# Patient Record
Sex: Female | Born: 1960 | Race: Black or African American | Hispanic: No | Smoking: Never smoker
Health system: Southern US, Community
[De-identification: ages and names within clinical notes are randomized; demographics above are authoritative.]

## PROBLEM LIST (undated history)

## (undated) DIAGNOSIS — G629 Polyneuropathy, unspecified: Secondary | ICD-10-CM

## (undated) DIAGNOSIS — I10 Essential (primary) hypertension: Secondary | ICD-10-CM

## (undated) DIAGNOSIS — K219 Gastro-esophageal reflux disease without esophagitis: Secondary | ICD-10-CM

## (undated) DIAGNOSIS — E119 Type 2 diabetes mellitus without complications: Secondary | ICD-10-CM

## (undated) HISTORY — PX: OTHER SURGICAL HISTORY: SHX169

## (undated) HISTORY — PX: MOUTH SURGERY: SHX715

## (undated) HISTORY — PX: SHOULDER SURGERY: SHX246

## (undated) HISTORY — PX: ABLATION ON ENDOMETRIOSIS: SHX5787

---

## 2007-06-05 ENCOUNTER — Ambulatory Visit: Payer: Self-pay | Admitting: Physician Assistant

## 2007-06-05 ENCOUNTER — Inpatient Hospital Stay (HOSPITAL_COMMUNITY): Admission: AD | Admit: 2007-06-05 | Discharge: 2007-06-05 | Payer: Self-pay | Admitting: Gynecology

## 2007-06-05 ENCOUNTER — Encounter: Payer: Self-pay | Admitting: Physician Assistant

## 2007-07-08 ENCOUNTER — Encounter (INDEPENDENT_AMBULATORY_CARE_PROVIDER_SITE_OTHER): Payer: Self-pay | Admitting: Gynecology

## 2007-07-08 ENCOUNTER — Ambulatory Visit: Payer: Self-pay | Admitting: Gynecology

## 2007-08-24 ENCOUNTER — Ambulatory Visit (HOSPITAL_COMMUNITY): Admission: RE | Admit: 2007-08-24 | Discharge: 2007-08-24 | Payer: Self-pay | Admitting: Gynecology

## 2007-08-24 ENCOUNTER — Ambulatory Visit: Payer: Self-pay | Admitting: Gynecology

## 2007-09-22 ENCOUNTER — Ambulatory Visit: Payer: Self-pay | Admitting: Gynecology

## 2008-06-03 ENCOUNTER — Emergency Department (HOSPITAL_COMMUNITY): Admission: EM | Admit: 2008-06-03 | Discharge: 2008-06-03 | Payer: Self-pay | Admitting: Emergency Medicine

## 2010-07-02 NOTE — Op Note (Signed)
[name], [name]              ACCOUNT NO.:  [id number]   MEDICAL RECORD NO.:  [id number]          PATIENT TYPE:  AMB   LOCATION:  SDC                           [facility]ACILITY:  [facility]   PHYSICIAN:  Ginger Carne, MD  DATE OF BIRTH:  06-04-1960   DATE OF PROCEDURE:  DATE OF DISCHARGE:                               OPERATIVE REPORT   PREOPERATIVE DIAGNOSES:  Menorrhagia and request for sterilization.   POSTOPERATIVE DIAGNOSES:  Menorrhagia and request for sterilization.   PROCEDURE:  Bilateral laparoscopic tubal cauterization and NovaSure  endometrial ablation.   SURGEON:  Ginger Carne, MD   ASSISTANT:  None.   COMPLICATIONS:  None immediate.   ESTIMATED BLOOD LOSS:  Minimal.   ANESTHESIA:  General.   SPECIMEN:  None.   OPERATIVE FINDINGS:  External genitalia, vulva, and vagina normal.  Cervix smooth without erosions or lesions.  Laparoscopic evaluation  identified both tubes and ovaries and uterus to be within normal limits.  The tubes were identified from their isthmus to fimbriated ends separate  apart from their respective round ligaments.  The NovaSure portion of  the procedure revealed the cavity length sounding at 5.5 cm with cavity  width of 2.7 cm and power at 87 watts.   OPERATIVE PROCEDURE:  The patient was prepped and draped in the usual  fashion and placed in lithotomy position.  Betadine solution used for  antiseptic, and the patient was catheterized prior to procedure.  After  adequate general anesthesia, a tenaculum placed on the anterior lip of  the cervix and a Hickman tenaculum placed in the endocervical canal.  Afterwards, a vertical infraumbilical incision was made and a Veress  needle placed in the abdomen.  Opening and closing pressures were 10 and  15 mmHg.  Needle release trocar placed in same incision, and laparoscope  placed in the trocar sleeve.  Two 5-mm ports were made in the left lower  quadrant under direct visualization.  The tubes  were identified and  approximately 3 cm of tube was cauterized from the isthmus and directed  towards the distal portion of the tube.  The tube on either side was  then cut with scissors.  No active bleeding noted.  No injuries noted.  Gas released.  Trocars removed.  Closure of the 10-mm fascia site with 0  Vicryl suture and 4-0 Vicryl for subcuticular closures.   NovaSure endometrial ablation was then performed.  Dimensions listed as  above in the findings section.  The procedure was performed without  complication or difficulty.  At the end of the procedure, the patient  tolerated procedure well, returned to the postanesthesia recovery room  in excellent condition.      Ginger Carne, MD  Electronically Signed     [name]  D:  08/24/2007  T:  08/24/2007  Job:  [id number]

## 2010-07-02 NOTE — Group Therapy Note (Signed)
[name], [name] NO.:  [id number]   MEDICAL RECORD NO.:  [id number]          PATIENT TYPE:  WOC   LOCATION:  [facility]                   FACILITY:  [facility]   PHYSICIAN:  Ginger Carne, MD DATE OF BIRTH:  07-19-1960   DATE OF SERVICE:                                  CLINIC NOTE   REASON FOR CONSULTATION:  Persistent vaginal bleeding.   HISTORY OF PRESENT ILLNESS:  This patient is a 50 year old gravida 2,  para 1-0-1-1 African-American female who has bled consistently since  August 2008.  She was seen in the maternity admission unit on June 05, 2007.  An endometrial biopsy was performed and demonstrated benign  disordered proliferative endometrial pattern without evidence of  neoplasia or hyperplasia.  A pelvic sonogram obtained on the same date  indicated multiple small uterine fibroids with a total sagittal length  of 8.2 cm.  Both adnexa were normal.  The patient takes no medications  to enhance her bleeding propensity and has no personal or family history  of bleeding diatheses.  The patient states she bleeds approximately  every 2 weeks with tapering down the last several days before restarting  again.  She has not been actively managed for said bleeding until now.  The patient is hypertensive and type 2 diabetic, and not a candidate for  contraceptive management.   ALLERGIES:  NONE to latex, iodine, medication or foods.   CURRENT MEDICATIONS:  1. Ferrex 150 mg once at night.  2. Metformin 500 mg in the morning daily.  3. Potassium chloride 10 mEq 1 daily.  4. Hydrochlorothiazide 25 mg 1 daily.  5. Naprosyn 500 mg 1 as needed.  6. Timolol Maleate drops to the left eye twice a day.  7. Nexium 40 mg daily.  8. Singulair 10 mg daily.   OBSTETRICAL/GYNECOLOGICAL HISTORY:  The patient has had 1 cesarean  section and 1 first trimester miscarriage.  She uses condoms for birth  control.   MEDICAL HISTORY:  1. Type 2 diabetes.  2. Hypertension.  3. Asthma.  4. Glaucoma.   PAST SURGICAL HISTORY:  Shoulder surgery in 1992.   SOCIAL HISTORY:  The patient is a nonsmoker.  Denies alcohol or illicit  drug abuse.   FAMILY HISTORY:  Her father has had a myocardial infarction.  Her sister  has hypertension.   REVIEW OF SYSTEMS:  A 14-point comprehensive review of systems within  normal limits.   PHYSICAL EXAMINATION:  VITAL SIGNS:  Blood pressure is 116/81, weight  194 pounds, height 5 feet 5 inches, pulse 73 and regular.  HEENT:  Grossly normal.  BREASTS:  Without masses, discharge, thickenings or tenderness.  CHEST:  Clear to percussion and auscultation.  CARDIOVASCULAR:  Without murmurs or enlargements, regular rate and  rhythm.  EXTREMITY, LYMPHATICS, SKIN, NEUROLOGICAL, MUSCULOSKELETAL  SYSTEMS:  Within normal limits.  ABDOMEN:  Soft without gross hepatosplenomegaly.  PELVIC:  External genitalia, vulva and vagina are normal.  Cervix smooth  without erosions or lesions.  The patient has slight bleeding, but a Pap  smear was performed.  Uterus is palpated to be about 8 weeks in size.  Both adnexa are palpable and found to be normal.   IMPRESSION:  Persistent vaginal bleeding, menometrorrhagia.   PLAN:  The patient was offered a Mirena IUD and endometrial ablation  (NovaSure technique) with accompanying laparoscopic bilateral tubal  cauterization and/or total vaginal hysterectomy with preservation of  both tubes and ovaries.  The patient opted for the laparoscopic  bilateral tubal cauterization and NovaSure endometrial tubal ablation.  Ashby Dawes of said procedure discussed in detail.  The patient understands  there is a 50% chance of persistent bleeding in the future, 50% will not  bleed at all.  The 50% who bleed may have minimal-moderate flow.  The  patient is accepting of this procedure.  The nature of a laparoscopic  tubal ligation was discussed and understood that the risk of failure was  02/998.  The patient will undergo  said procedure in the near future.           ______________________________  Ginger Carne, MD     SHB/MEDQ  D:  07/08/2007  T:  07/08/2007  Job:  [id number]

## 2010-07-02 NOTE — Group Therapy Note (Signed)
[name], TEAT NO.:  [id number]   MEDICAL RECORD NO.:  [id number]          PATIENT TYPE:  WOC   LOCATION:  [facility]                   FACILITY:  [facility]   PHYSICIAN:  Ginger Carne, MD DATE OF BIRTH:  09-Aug-1960   DATE OF SERVICE:  09/22/2007                                  CLINIC NOTE   Ms. [name] returns today as a 50 year old African American female, who  on August 24, 2007, had undergone a bilateral laparoscopic tubal  cauterization and NovaSure endometrial ablation.  Apart from one day of  spotting, the patient has had no further bleeding or cramping.  Endometrial biopsy from 06/05/2007, revealed fragments of benign  endometrial polyp, disordered proliferative endometrium, without  neoplasia or hyperplasia.   PHYSICAL EXAM:  Blood pressure is 115/78, weight 197 pounds, height 5  feet 5 inches, pulse 66 and regular.  ABDOMEN:  Soft.  Incision is well-healed.  PELVIC:  Deferred.   IMPRESSION AND PLAN:  Patient was advised to return for her yearly  examination.  She can resume all activities, including intercourse, and  she was informed that, from time to time, she may experience some  spotting or bleeding, but that this is expected and not to be construed  as the procedure being ineffective.           ______________________________  Ginger Carne, MD     SHB/MEDQ  D:  09/22/2007  T:  09/22/2007  Job:  [id number]

## 2010-11-12 LAB — WET PREP, GENITAL
Clue Cells Wet Prep HPF POC: NONE SEEN
Trich, Wet Prep: NONE SEEN
Yeast Wet Prep HPF POC: NONE SEEN

## 2010-11-12 LAB — CBC
MCHC: 34.1
MCV: 90.1
Platelets: 203
WBC: 5.2

## 2010-11-12 LAB — POCT PREGNANCY, URINE: Operator id: 181461

## 2010-11-12 LAB — GC/CHLAMYDIA PROBE AMP, GENITAL: GC Probe Amp, Genital: NEGATIVE

## 2010-11-14 LAB — CBC
RBC: 3.85 — ABNORMAL LOW
WBC: 5

## 2010-11-14 LAB — BASIC METABOLIC PANEL
Calcium: 9.1
Creatinine, Ser: 0.76
GFR calc Af Amer: >60
Sodium: 137

## 2012-12-03 DIAGNOSIS — G43109 Migraine with aura, not intractable, without status migrainosus: Secondary | ICD-10-CM | POA: Insufficient documentation

## 2013-05-12 ENCOUNTER — Encounter (HOSPITAL_BASED_OUTPATIENT_CLINIC_OR_DEPARTMENT_OTHER): Payer: Self-pay | Admitting: Emergency Medicine

## 2013-05-12 ENCOUNTER — Emergency Department (HOSPITAL_BASED_OUTPATIENT_CLINIC_OR_DEPARTMENT_OTHER)
Admission: EM | Admit: 2013-05-12 | Discharge: 2013-05-12 | Disposition: A | Discharge status: Home / Self Care | Payer: Self-pay | Attending: Emergency Medicine | Admitting: Emergency Medicine

## 2013-05-12 ENCOUNTER — Emergency Department (HOSPITAL_BASED_OUTPATIENT_CLINIC_OR_DEPARTMENT_OTHER): Payer: Self-pay

## 2013-05-12 DIAGNOSIS — Y9389 Activity, other specified: Secondary | ICD-10-CM | POA: Insufficient documentation

## 2013-05-12 DIAGNOSIS — I10 Essential (primary) hypertension: Secondary | ICD-10-CM | POA: Insufficient documentation

## 2013-05-12 DIAGNOSIS — M79673 Pain in unspecified foot: Secondary | ICD-10-CM

## 2013-05-12 DIAGNOSIS — Z79899 Other long term (current) drug therapy: Secondary | ICD-10-CM | POA: Insufficient documentation

## 2013-05-12 DIAGNOSIS — E119 Type 2 diabetes mellitus without complications: Secondary | ICD-10-CM | POA: Insufficient documentation

## 2013-05-12 DIAGNOSIS — S8990XA Unspecified injury of unspecified lower leg, initial encounter: Secondary | ICD-10-CM | POA: Insufficient documentation

## 2013-05-12 DIAGNOSIS — G589 Mononeuropathy, unspecified: Secondary | ICD-10-CM | POA: Insufficient documentation

## 2013-05-12 DIAGNOSIS — W06XXXA Fall from bed, initial encounter: Secondary | ICD-10-CM | POA: Insufficient documentation

## 2013-05-12 DIAGNOSIS — S99919A Unspecified injury of unspecified ankle, initial encounter: Principal | ICD-10-CM

## 2013-05-12 DIAGNOSIS — Y929 Unspecified place or not applicable: Secondary | ICD-10-CM | POA: Insufficient documentation

## 2013-05-12 DIAGNOSIS — K219 Gastro-esophageal reflux disease without esophagitis: Secondary | ICD-10-CM | POA: Insufficient documentation

## 2013-05-12 DIAGNOSIS — S99929A Unspecified injury of unspecified foot, initial encounter: Principal | ICD-10-CM

## 2013-05-12 HISTORY — DX: Gastro-esophageal reflux disease without esophagitis: K21.9

## 2013-05-12 HISTORY — DX: Type 2 diabetes mellitus without complications: E11.9

## 2013-05-12 HISTORY — DX: Polyneuropathy, unspecified: G62.9

## 2013-05-12 HISTORY — DX: Essential (primary) hypertension: I10

## 2013-05-12 LAB — CBC
HEMATOCRIT: 39 % (ref 36.0–46.0)
HEMOGLOBIN: 13 g/dL (ref 12.0–15.0)
MCH: 31.4 pg (ref 26.0–34.0)
MCHC: 33.3 g/dL (ref 30.0–36.0)
MCV: 94.2 fL (ref 78.0–100.0)
Platelets: 169 10*3/uL (ref 150–400)
RBC: 4.14 MIL/uL (ref 3.87–5.11)
RDW: 12 % (ref 11.5–15.5)
WBC: 4.5 10*3/uL (ref 4.0–10.5)

## 2013-05-12 LAB — CBG MONITORING, ED: Glucose-Capillary: 128 mg/dL — ABNORMAL HIGH (ref 70–99)

## 2013-05-12 LAB — BASIC METABOLIC PANEL
BUN: 16 mg/dL (ref 6–23)
CO2: 27 mEq/L (ref 19–32)
CREATININE: 0.9 mg/dL (ref 0.50–1.10)
Calcium: 9.3 mg/dL (ref 8.4–10.5)
Chloride: 103 mEq/L (ref 96–112)
GFR, EST AFRICAN AMERICAN: 83 mL/min — AB (ref >90)
GFR, EST NON AFRICAN AMERICAN: 72 mL/min — AB (ref >90)
GLUCOSE: 128 mg/dL — AB (ref 70–99)
Potassium: 3.9 mEq/L (ref 3.7–5.3)
Sodium: 142 mEq/L (ref 137–147)

## 2013-05-12 MED ORDER — TRAMADOL HCL 50 MG PO TABS: 50.0000 mg | ORAL_TABLET | Freq: Four times a day (QID) | ORAL | Status: DC | PRN

## 2013-05-12 NOTE — ED Notes (Signed)
Pt. Daughter is Seth Bake phone # [phone]

## 2013-05-12 NOTE — ED Provider Notes (Signed)
CSN: [id number]     Arrival date & time 05/12/13  1501 History   First MD Initiated Contact with Patient 05/12/13 1505     Chief Complaint  Patient presents with  . Foot Pain     (Consider location/radiation/quality/duration/timing/severity/associated sxs/prior Treatment) HPI Comments: Pt states she has been having cramping in the right foot for the last year. Pt states that it caused her to fall getting out of bed this morning. Pt states that sometimes the ankle gives out on her. Pt states that her pcp told her that she need to see ortho but she hasn't been able to. Pt states that she feels like the right side is week but that has been going on for a year and she has been evaluated for it and nothing is ever found  The history is provided by the patient. No language interpreter was used.    Past Medical History  Diagnosis Date  . Diabetes mellitus without complication   . Hypertension   . GERD (gastroesophageal reflux disease)   . Neuropathy    Past Surgical History  Procedure Laterality Date  . Shouder surgery     No family history on file. History  Substance Use Topics  . Smoking status: Never Smoker   . Smokeless tobacco: Not on file  . Alcohol Use: No   OB History   Grav Para Term Preterm Abortions TAB SAB Ect Mult Living                 Review of Systems  Constitutional: Negative.   Respiratory: Negative.   Cardiovascular: Negative.       Allergies  Review of patient's allergies indicates no known allergies.  Home Medications   Current Outpatient Rx  Name  Route  Sig  Dispense  Refill  . esomeprazole (NEXIUM) 40 MG capsule   Oral   Take 40 mg by mouth daily at 12 noon.         . gabapentin (NEURONTIN) 300 MG capsule   Oral   Take 300 mg by mouth 3 (three) times daily.         Marland Kitchen HYDROCHLOROTHIAZIDE PO   Oral   Take by mouth.         Marland Kitchen lisinopril (PRINIVIL,ZESTRIL) 5 MG tablet   Oral   Take 5 mg by mouth daily.         Marland Kitchen loratadine  (CLARITIN) 10 MG tablet   Oral   Take 10 mg by mouth daily.         . metFORMIN (GLUCOPHAGE) 500 MG tablet   Oral   Take by mouth 2 (two) times daily with a meal.         . montelukast (SINGULAIR) 10 MG tablet   Oral   Take 10 mg by mouth at bedtime.         . potassium chloride (K-DUR) 10 MEQ tablet   Oral   Take 10 mEq by mouth daily.          BP 126/95  Pulse 89  Temp(Src) 97.6 F (36.4 C) (Oral)  Resp 20  Ht 5\' 5"  (1.651 m)  Wt 187 lb (84.823 kg)  BMI 31.12 kg/m2  SpO2 100% Physical Exam  Nursing note and vitals reviewed. Constitutional: She is oriented to person, place, and time. She appears well-developed and well-nourished.  Cardiovascular: Normal rate and regular rhythm.   Pulmonary/Chest: Effort normal and breath sounds normal.  Musculoskeletal:  Pt has generalized pain with palpation to the  right ankle.no gross deformity noted. Pt ambulates without assistance;no abnormality noted  Neurological: She is alert and oriented to person, place, and time. She exhibits normal muscle tone. Coordination normal.  Skin: Skin is warm and dry.  Psychiatric: She has a normal mood and affect.    ED Course  Procedures (including critical care time) Labs Review Labs Reviewed  BASIC METABOLIC PANEL - Abnormal; Notable for the following:    Glucose, Bld 128 (*)    GFR calc non Af Amer 72 (*)    GFR calc Af Amer 83 (*)    All other components within normal limits  CBG MONITORING, ED - Abnormal; Notable for the following:    Glucose-Capillary 128 (*)    All other components within normal limits  CBC   Imaging Review Dg Ankle Complete Right  05/12/2013   CLINICAL DATA:  [place] about today.  Foot and ankle pain.  EXAM: RIGHT ANKLE - COMPLETE 3+ VIEW  COMPARISON:  None.  FINDINGS: There is no evidence of fracture, dislocation, or joint effusion. There is no evidence of arthropathy or other focal bone abnormality. Soft tissues are unremarkable.  IMPRESSION: Negative.    Electronically Signed   By: Shon Hale M.D.   On: 05/12/2013 15:51   Dg Foot Complete Right  05/12/2013   CLINICAL DATA:  [place] out of bed today.  Right foot and ankle pain.  EXAM: RIGHT FOOT COMPLETE - 3+ VIEW  COMPARISON:  None  FINDINGS: There is no evidence of fracture or dislocation. There is no evidence of arthropathy or other focal bone abnormality. Soft tissues are unremarkable.  IMPRESSION: Negative.   Electronically Signed   By: Shon Hale M.D.   On: 05/12/2013 15:50     EKG Interpretation None      MDM   Final diagnoses:  Foot pain    No swelling noted. Pt is neurologically intact. No fracture noted. Will treat for pain    Glendell Docker, NP 05/12/13 1702

## 2013-05-12 NOTE — ED Notes (Signed)
CBG done per NP verbal order.  128 result, RN at bedside aware.

## 2013-05-12 NOTE — ED Notes (Signed)
Pain and cramping in her right foot. Pain caused her to fall when getting out of bed this am. She refused w/c and insisted on walking to tx room.

## 2013-05-12 NOTE — ED Notes (Signed)
Pt. Daughter called by Eldridge Dace.  Pt. Able to walk with no distress and aware of her discharge diagnosis.

## 2013-05-12 NOTE — Discharge Instructions (Signed)

## 2013-05-12 NOTE — ED Provider Notes (Signed)
Medical screening examination/treatment/procedure(s) were performed by non-physician practitioner and as supervising physician I was immediately available for consultation/collaboration.   EKG Interpretation None        Jerica Creegan, MD 05/12/13 1816 

## 2013-05-12 NOTE — ED Notes (Signed)
Mild weakness noted for a year per Pt. On the R side.

## 2013-05-18 ENCOUNTER — Emergency Department (HOSPITAL_BASED_OUTPATIENT_CLINIC_OR_DEPARTMENT_OTHER)
Admission: EM | Admit: 2013-05-18 | Discharge: 2013-05-18 | Disposition: A | Discharge status: Home / Self Care | Payer: Self-pay | Attending: Emergency Medicine | Admitting: Emergency Medicine

## 2013-05-18 ENCOUNTER — Encounter (HOSPITAL_BASED_OUTPATIENT_CLINIC_OR_DEPARTMENT_OTHER): Payer: Self-pay | Admitting: Emergency Medicine

## 2013-05-18 ENCOUNTER — Emergency Department (HOSPITAL_BASED_OUTPATIENT_CLINIC_OR_DEPARTMENT_OTHER): Payer: Self-pay

## 2013-05-18 DIAGNOSIS — G589 Mononeuropathy, unspecified: Secondary | ICD-10-CM | POA: Insufficient documentation

## 2013-05-18 DIAGNOSIS — M79609 Pain in unspecified limb: Secondary | ICD-10-CM | POA: Insufficient documentation

## 2013-05-18 DIAGNOSIS — I10 Essential (primary) hypertension: Secondary | ICD-10-CM | POA: Insufficient documentation

## 2013-05-18 DIAGNOSIS — M79676 Pain in unspecified toe(s): Secondary | ICD-10-CM

## 2013-05-18 DIAGNOSIS — E119 Type 2 diabetes mellitus without complications: Secondary | ICD-10-CM | POA: Insufficient documentation

## 2013-05-18 DIAGNOSIS — Z79899 Other long term (current) drug therapy: Secondary | ICD-10-CM | POA: Insufficient documentation

## 2013-05-18 DIAGNOSIS — B351 Tinea unguium: Secondary | ICD-10-CM | POA: Insufficient documentation

## 2013-05-18 DIAGNOSIS — K219 Gastro-esophageal reflux disease without esophagitis: Secondary | ICD-10-CM | POA: Insufficient documentation

## 2013-05-18 MED ORDER — FLUCONAZOLE 200 MG PO TABS: 200.0000 mg | ORAL_TABLET | ORAL | Status: AC

## 2013-05-18 MED ORDER — HYDROCODONE-ACETAMINOPHEN 5-325 MG PO TABS: 1.0000 | ORAL_TABLET | ORAL | Status: DC | PRN

## 2013-05-18 NOTE — ED Notes (Signed)
Pt c/o left great toe pain x 2 months. Pt ambulatory.

## 2013-05-18 NOTE — ED Notes (Signed)
Patient left big tow pain. Swelling and pain toward the outer side.

## 2013-05-18 NOTE — ED Provider Notes (Addendum)
CSN: [id number]     Arrival date & time 05/18/13  1318 History   First MD Initiated Contact with Patient 05/18/13 1502     Chief Complaint  Patient presents with  . Toe Pain     (Consider location/radiation/quality/duration/timing/severity/associated sxs/prior Treatment) Patient is a 53 y.o. female presenting with toe pain. The history is provided by the patient.  Toe Pain This is a new problem. Episode onset: 2 months. The problem occurs constantly. The problem has been gradually worsening. Associated symptoms comments: Pain in the great toe for the last 2 months and is worsening.  Sometimes pain in the 2nd and 3rd toe.  Ocassional swelling but no redness or lesions. The symptoms are aggravated by walking. Nothing relieves the symptoms. Treatments tried: tramadol. The treatment provided no relief.    Past Medical History  Diagnosis Date  . Diabetes mellitus without complication   . Hypertension   . GERD (gastroesophageal reflux disease)   . Neuropathy    Past Surgical History  Procedure Laterality Date  . Shouder surgery     No family history on file. History  Substance Use Topics  . Smoking status: Never Smoker   . Smokeless tobacco: Not on file  . Alcohol Use: No   OB History   Grav Para Term Preterm Abortions TAB SAB Ect Mult Living                 Review of Systems  All other systems reviewed and are negative.      Allergies  Review of patient's allergies indicates no known allergies.  Home Medications   Current Outpatient Rx  Name  Route  Sig  Dispense  Refill  . esomeprazole (NEXIUM) 40 MG capsule   Oral   Take 40 mg by mouth daily at 12 noon.         . gabapentin (NEURONTIN) 300 MG capsule   Oral   Take 300 mg by mouth 3 (three) times daily.         Marland Kitchen HYDROCHLOROTHIAZIDE PO   Oral   Take by mouth.         Marland Kitchen lisinopril (PRINIVIL,ZESTRIL) 5 MG tablet   Oral   Take 5 mg by mouth daily.         Marland Kitchen loratadine (CLARITIN) 10 MG tablet    Oral   Take 10 mg by mouth daily.         . metFORMIN (GLUCOPHAGE) 500 MG tablet   Oral   Take by mouth 2 (two) times daily with a meal.         . montelukast (SINGULAIR) 10 MG tablet   Oral   Take 10 mg by mouth at bedtime.         . potassium chloride (K-DUR) 10 MEQ tablet   Oral   Take 10 mEq by mouth daily.         . traMADol (ULTRAM) 50 MG tablet   Oral   Take 1 tablet (50 mg total) by mouth every 6 (six) hours as needed.   15 tablet   0    BP 128/89  Pulse 92  Temp(Src) 98.5 F (36.9 C) (Oral)  Resp 16  Ht 5\' 5"  (1.651 m)  Wt 187 lb (84.823 kg)  BMI 31.12 kg/m2  SpO2 99% Physical Exam  Nursing note and vitals reviewed. Constitutional: She is oriented to person, place, and time. She appears well-developed and well-nourished. No distress.  HENT:  Head: Normocephalic and atraumatic.  Eyes: Pupils  are equal, round, and reactive to light.  Cardiovascular: Normal rate.   Pulmonary/Chest: Effort normal.  Musculoskeletal: She exhibits tenderness.       Feet:  Neurological: She is alert and oriented to person, place, and time.    ED Course  Procedures (including critical care time) Labs Review Labs Reviewed - No data to display Imaging Review Dg Toe Great Left  05/18/2013   CLINICAL DATA:  Two-month history of pain  EXAM: LEFT FIRST TOE  COMPARISON:  None.  FINDINGS: Frontal, oblique, and lateral views were obtained. There is no fracture or dislocation. Joint spaces appear intact. No erosive change.  IMPRESSION: No abnormality noted.   Electronically Signed   By: Lowella Grip M.D.   On: 05/18/2013 15:27     EKG Interpretation None      MDM   Final diagnoses:  Toe pain  Onychomycosis   Pt with toe pain for 2 months without external signs of infection or diabetic ulcer.  Does have onychomycosis of the 1st-3rd nail.  Denies any injury but pain is worsening.  Plain film neg and will treat for onychomycosis with Diflucan as patient states she  recently lost her job and insurance and cannot portly muscle. Give her followup with podiatry if symptoms do not improve.    Blanchie Dessert, MD 05/18/13 [place], MD 05/18/13 1554

## 2013-06-07 ENCOUNTER — Emergency Department (HOSPITAL_BASED_OUTPATIENT_CLINIC_OR_DEPARTMENT_OTHER)
Admission: EM | Admit: 2013-06-07 | Discharge: 2013-06-07 | Disposition: A | Discharge status: Home / Self Care | Payer: Self-pay | Attending: Emergency Medicine | Admitting: Emergency Medicine

## 2013-06-07 ENCOUNTER — Encounter (HOSPITAL_BASED_OUTPATIENT_CLINIC_OR_DEPARTMENT_OTHER): Payer: Self-pay | Admitting: Emergency Medicine

## 2013-06-07 DIAGNOSIS — I10 Essential (primary) hypertension: Secondary | ICD-10-CM | POA: Insufficient documentation

## 2013-06-07 DIAGNOSIS — T783XXA Angioneurotic edema, initial encounter: Secondary | ICD-10-CM | POA: Insufficient documentation

## 2013-06-07 DIAGNOSIS — T465X5A Adverse effect of other antihypertensive drugs, initial encounter: Secondary | ICD-10-CM | POA: Insufficient documentation

## 2013-06-07 DIAGNOSIS — Z79899 Other long term (current) drug therapy: Secondary | ICD-10-CM | POA: Insufficient documentation

## 2013-06-07 DIAGNOSIS — K219 Gastro-esophageal reflux disease without esophagitis: Secondary | ICD-10-CM | POA: Insufficient documentation

## 2013-06-07 DIAGNOSIS — E119 Type 2 diabetes mellitus without complications: Secondary | ICD-10-CM | POA: Insufficient documentation

## 2013-06-07 NOTE — Discharge Instructions (Signed)
Stop your lisinopril and do not take any ace inhibitors in the future.  Recheck with your doctor to determine a new type of blood pressure medicine.  Return here immediately if any worsening swelling especially if you have any difficulty speaking, swallowing, or breathing.  Angioedema Angioedema is sudden puffiness (swelling), often of the skin. It can happen:  On your face or privates (genitals).  In your belly (abdomen) or other body parts. It usually happens quickly and gets better in 1 or 2 days. It often starts at night and is found when you wake up. You may get red, itchy patches of skin (hives). Attacks can be dangerous if your breathing passages get puffy. The condition may happen only once, or it can come back at random times. It may happen for several years before it goes away for good. HOME CARE  Only take medicines as told by your doctor.  Always carry your emergency allergy medicines with you.  Wear a medical bracelet as told by your doctor.  Avoid things that you know will cause attacks (triggers). GET HELP IF:  You have another attack.  Your attacks happen more often or get worse.  The condition was passed to you by your parents and you want to have children. GET HELP RIGHT AWAY IF:   Your mouth, tongue, or lips are very puffy.  You have trouble breathing.  You have trouble swallowing.  You pass out (faint). MAKE SURE YOU:   Understand these instructions.  Will watch your condition.  Will get help right away if you are not doing well or get worse. Document Released: 01/22/2009 Document Revised: 11/24/2012 Document Reviewed: 09/27/2012 [facility] Patient Information 2014 [place], [place].

## 2013-06-07 NOTE — ED Notes (Signed)
Lower lip swelling x2 hours.  Has been taking Lisinopril less than 1 month.

## 2013-06-14 ENCOUNTER — Encounter (HOSPITAL_BASED_OUTPATIENT_CLINIC_OR_DEPARTMENT_OTHER): Payer: Self-pay | Admitting: Emergency Medicine

## 2013-06-14 ENCOUNTER — Emergency Department (HOSPITAL_BASED_OUTPATIENT_CLINIC_OR_DEPARTMENT_OTHER)
Admission: EM | Admit: 2013-06-14 | Discharge: 2013-06-14 | Disposition: A | Discharge status: Home / Self Care | Payer: Self-pay | Attending: Emergency Medicine | Admitting: Emergency Medicine

## 2013-06-14 ENCOUNTER — Emergency Department (HOSPITAL_BASED_OUTPATIENT_CLINIC_OR_DEPARTMENT_OTHER): Payer: Self-pay

## 2013-06-14 DIAGNOSIS — M5380 Other specified dorsopathies, site unspecified: Secondary | ICD-10-CM | POA: Insufficient documentation

## 2013-06-14 DIAGNOSIS — Z79899 Other long term (current) drug therapy: Secondary | ICD-10-CM | POA: Insufficient documentation

## 2013-06-14 DIAGNOSIS — Y9389 Activity, other specified: Secondary | ICD-10-CM | POA: Insufficient documentation

## 2013-06-14 DIAGNOSIS — Y929 Unspecified place or not applicable: Secondary | ICD-10-CM | POA: Insufficient documentation

## 2013-06-14 DIAGNOSIS — R296 Repeated falls: Secondary | ICD-10-CM | POA: Insufficient documentation

## 2013-06-14 DIAGNOSIS — IMO0002 Reserved for concepts with insufficient information to code with codable children: Secondary | ICD-10-CM | POA: Insufficient documentation

## 2013-06-14 DIAGNOSIS — M62838 Other muscle spasm: Secondary | ICD-10-CM

## 2013-06-14 DIAGNOSIS — E119 Type 2 diabetes mellitus without complications: Secondary | ICD-10-CM | POA: Insufficient documentation

## 2013-06-14 DIAGNOSIS — Z87828 Personal history of other (healed) physical injury and trauma: Secondary | ICD-10-CM | POA: Insufficient documentation

## 2013-06-14 DIAGNOSIS — M549 Dorsalgia, unspecified: Secondary | ICD-10-CM

## 2013-06-14 DIAGNOSIS — K219 Gastro-esophageal reflux disease without esophagitis: Secondary | ICD-10-CM | POA: Insufficient documentation

## 2013-06-14 DIAGNOSIS — I10 Essential (primary) hypertension: Secondary | ICD-10-CM | POA: Insufficient documentation

## 2013-06-14 DIAGNOSIS — G8929 Other chronic pain: Secondary | ICD-10-CM | POA: Insufficient documentation

## 2013-06-14 DIAGNOSIS — G589 Mononeuropathy, unspecified: Secondary | ICD-10-CM | POA: Insufficient documentation

## 2013-06-14 MED ORDER — IBUPROFEN 800 MG PO TABS
800.0000 mg | ORAL_TABLET | Freq: Once | ORAL | Status: AC
  Administered 2013-06-14: 800 mg via ORAL
  Filled 2013-06-14: qty 1

## 2013-06-14 MED ORDER — MORPHINE SULFATE 4 MG/ML IJ SOLN
6.0000 mg | Freq: Once | INTRAMUSCULAR | Status: AC
  Administered 2013-06-14: 6 mg via INTRAMUSCULAR
  Filled 2013-06-14: qty 2

## 2013-06-14 MED ORDER — IBUPROFEN 800 MG PO TABS: 800.0000 mg | ORAL_TABLET | Freq: Three times a day (TID) | ORAL | Status: DC | PRN

## 2013-06-14 MED ORDER — ONDANSETRON 4 MG PO TBDP
4.0000 mg | ORAL_TABLET | Freq: Once | ORAL | Status: AC
  Administered 2013-06-14: 4 mg via ORAL
  Filled 2013-06-14: qty 1

## 2013-06-14 MED ORDER — TRAMADOL HCL 50 MG PO TABS: 50.0000 mg | ORAL_TABLET | Freq: Four times a day (QID) | ORAL | Status: DC | PRN
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
## 2013-06-14 NOTE — Discharge Instructions (Signed)
Back Pain, Adult Low back pain is very common. About 1 in 5 people have back pain.The cause of low back pain is rarely dangerous. The pain often gets better over time.About half of people with a sudden onset of back pain feel better in just 2 weeks. About 8 in 10 people feel better by 6 weeks.  CAUSES Some common causes of back pain include:  Strain of the muscles or ligaments supporting the spine.  Wear and tear (degeneration) of the spinal discs.  Arthritis.  Direct injury to the back. DIAGNOSIS Most of the time, the direct cause of low back pain is not known.However, back pain can be treated effectively even when the exact cause of the pain is unknown.Answering your caregiver's questions about your overall health and symptoms is one of the most accurate ways to make sure the cause of your pain is not dangerous. If your caregiver needs more information, he or she may order lab work or imaging tests (X-rays or MRIs).However, even if imaging tests show changes in your back, this usually does not require surgery. HOME CARE INSTRUCTIONS For many people, back pain returns.Since low back pain is rarely dangerous, it is often a condition that people can learn to [facility] their own.   Remain active. It is stressful on the back to sit or stand in one place. Do not sit, drive, or stand in one place for more than 30 minutes at a time. Take short walks on level surfaces as soon as pain allows.Try to increase the length of time you walk each day.  Do not stay in bed.Resting more than 1 or 2 days can delay your recovery.  Do not avoid exercise or work.Your body is made to move.It is not dangerous to be active, even though your back may hurt.Your back will likely heal faster if you return to being active before your pain is gone.  Pay attention to your body when you bend and lift. Many people have less discomfortwhen lifting if they bend their knees, keep the load close to their bodies,and  avoid twisting. Often, the most comfortable positions are those that put less stress on your recovering back.  Find a comfortable position to sleep. Use a firm mattress and lie on your side with your knees slightly bent. If you lie on your back, put a pillow under your knees.  Only take over-the-counter or prescription medicines as directed by your caregiver. Over-the-counter medicines to reduce pain and inflammation are often the most helpful.Your caregiver may prescribe muscle relaxant drugs.These medicines help dull your pain so you can more quickly return to your normal activities and healthy exercise.  Put ice on the injured area.  Put ice in a plastic bag.  Place a towel between your skin and the bag.  Leave the ice on for 15-20 minutes, 03-04 times a day for the first 2 to 3 days. After that, ice and heat may be alternated to reduce pain and spasms.  Ask your caregiver about trying back exercises and gentle massage. This may be of some benefit.  Avoid feeling anxious or stressed.Stress increases muscle tension and can worsen back pain.It is important to recognize when you are anxious or stressed and learn ways to manage it.Exercise is a great option. SEEK MEDICAL CARE IF:  You have pain that is not relieved with rest or medicine.  You have pain that does not improve in 1 week.  You have new symptoms.  You are generally not feeling well. SEEK  IMMEDIATE MEDICAL CARE IF:   You have pain that radiates from your back into your legs.  You develop new bowel or bladder control problems.  You have unusual weakness or numbness in your arms or legs.  You develop nausea or vomiting.  You develop abdominal pain.  You feel faint. Document Released: 02/03/2005 Document Revised: 08/05/2011 Document Reviewed: 06/24/2010 [employer] Patient Information 2014 [employer]. Back Exercises Back exercises help treat and prevent back injuries. The goal of back exercises is to increase  the strength of your abdominal and back muscles and the flexibility of your back. These exercises should be started when you no longer have back pain. Back exercises include:  Pelvic Tilt. Lie on your back with your knees bent. Tilt your pelvis until the lower part of your back is against the floor. Hold this position 5 to 10 sec and repeat 5 to 10 times.  Knee to Chest. Pull first 1 knee up against your chest and hold for 20 to 30 seconds, repeat this with the other knee, and then both knees. This may be done with the other leg straight or bent, whichever feels better.  Sit-Ups or Curl-Ups. Bend your knees 90 degrees. Start with tilting your pelvis, and do a partial, slow sit-up, lifting your trunk only 30 to 45 degrees off the floor. Take at least 2 to 3 seconds for each sit-up. Do not do sit-ups with your knees out straight. If partial sit-ups are difficult, simply do the above but with only tightening your abdominal muscles and holding it as directed.  Hip-Lift. Lie on your back with your knees flexed 90 degrees. Push down with your feet and shoulders as you raise your hips a couple inches off the floor; hold for 10 seconds, repeat 5 to 10 times.  Back arches. Lie on your stomach, propping yourself up on bent elbows. Slowly press on your hands, causing an arch in your low back. Repeat 3 to 5 times. Any initial stiffness and discomfort should lessen with repetition over time.  Shoulder-Lifts. Lie face down with arms beside your body. Keep hips and torso pressed to floor as you slowly lift your head and shoulders off the floor. Do not overdo your exercises, especially in the beginning. Exercises may cause you some mild back discomfort which lasts for a few minutes; however, if the pain is more severe, or lasts for more than 15 minutes, do not continue exercises until you see your caregiver. Improvement with exercise therapy for back problems is slow.  See your caregivers for assistance with  developing a proper back exercise program. Document Released: 03/13/2004 Document Revised: 04/28/2011 Document Reviewed: 12/05/2010 [employer] Patient Information 2014 [employer].  

## 2013-06-14 NOTE — ED Notes (Signed)
Chronic back pain. [place] this am and has had pain in her lower back since.

## 2013-06-15 NOTE — ED Provider Notes (Signed)
CSN: [id number]     Arrival date & time 05/18/13  1318 History   First MD Initiated Contact with Patient 05/18/13 1502     Chief Complaint  Patient presents with  . Toe Pain     (Consider location/radiation/quality/duration/timing/severity/associated sxs/prior Treatment) HPI This is a 53 year old female who came in today after swelling that began on her lower lip after taking a new medication. She denies any tongue swelling throat swelling or difficulty breathing. It began approximately an hour prior to my evaluation. She came in secondary to this. She has not noticed any similar events in the past. She has recently started taking lisinopril for her high blood pressure.  Past Medical History  Diagnosis Date  . Diabetes mellitus without complication   . Hypertension   . GERD (gastroesophageal reflux disease)   . Neuropathy    Past Surgical History  Procedure Laterality Date  . Shouder surgery    . Ablation on endometriosis     No family history on file. History  Substance Use Topics  . Smoking status: Never Smoker   . Smokeless tobacco: Not on file  . Alcohol Use: No   OB History   Grav Para Term Preterm Abortions TAB SAB Ect Mult Living                 Review of Systems  All other systems reviewed and are negative.     Allergies  Ace inhibitors  Home Medications   Prior to Admission medications   Medication Sig Start Date End Date Taking? Authorizing Provider  esomeprazole (NEXIUM) 40 MG capsule Take 40 mg by mouth daily at 12 noon.    Historical Provider, MD  gabapentin (NEURONTIN) 300 MG capsule Take 300 mg by mouth 3 (three) times daily.    Historical Provider, MD  HYDROCHLOROTHIAZIDE PO Take by mouth.    Historical Provider, MD  HYDROcodone-acetaminophen (NORCO/VICODIN) 5-325 MG per tablet Take 1-2 tablets by mouth every 4 (four) hours as needed. 05/18/13   Blanchie Dessert, MD  ibuprofen (ADVIL,MOTRIN) 800 MG tablet Take 1 tablet (800 mg total) by mouth every 8  (eight) hours as needed for mild pain. 06/14/13   Kristen N Ward, DO  lisinopril (PRINIVIL,ZESTRIL) 5 MG tablet Take 5 mg by mouth daily.    Historical Provider, MD  loratadine (CLARITIN) 10 MG tablet Take 10 mg by mouth daily.    Historical Provider, MD  metFORMIN (GLUCOPHAGE) 500 MG tablet Take by mouth 2 (two) times daily with a meal.    Historical Provider, MD  montelukast (SINGULAIR) 10 MG tablet Take 10 mg by mouth at bedtime.    Historical Provider, MD  potassium chloride (K-DUR) 10 MEQ tablet Take 10 mEq by mouth daily.    Historical Provider, MD  propranolol (INDERAL) 20 MG tablet Take 120 mg by mouth at bedtime.    Historical Provider, MD  traMADol (ULTRAM) 50 MG tablet Take 1 tablet (50 mg total) by mouth every 6 (six) hours as needed. 05/12/13   Glendell Docker, NP  traMADol (ULTRAM) 50 MG tablet Take 1 tablet (50 mg total) by mouth every 6 (six) hours as needed. 06/14/13   Kristen N Ward, DO   BP 128/89  Pulse 92  Temp(Src) 98.5 F (36.9 C) (Oral)  Resp 16  Ht 5\' 5"  (1.651 m)  Wt 187 lb (84.823 kg)  BMI 31.12 kg/m2  SpO2 99% Physical Exam  Nursing note and vitals reviewed. Constitutional: She is oriented to person, place, and time. She appears  well-developed and well-nourished.  HENT:  Head: Normocephalic and atraumatic.  Right Ear: External ear normal.  Left Ear: External ear normal.  Nose: Nose normal.  Mouth/Throat: Oropharynx is clear and moist.  Swelling noted of the lower right lip.  Eyes: Conjunctivae and EOM are normal. Pupils are equal, round, and reactive to light.  Neck: Normal range of motion. Neck supple.  Cardiovascular: Normal rate, regular rhythm, normal heart sounds and intact distal pulses.   Pulmonary/Chest: Effort normal and breath sounds normal. No respiratory distress. She has no wheezes.  Abdominal: Soft. Bowel sounds are normal.  Musculoskeletal: Normal range of motion.  Neurological: She is alert and oriented to person, place, and time.  Skin:  Skin is warm and dry.  Psychiatric: She has a normal mood and affect. Her behavior is normal.    ED Course  Procedures (including critical care time) Labs Review Labs Reviewed - No data to display  Imaging Review   EKG Interpretation None       Patient observed for several hours and swelling has decreased some. She has had no progression to any time or throat swelling. She is advised against using lisinopril or any ACE inhibitor in the future. She will followup with her primary care Dr. for new antihypertensive prescription.    Shaune Pollack, MD 06/15/13 [phone]

## 2013-07-03 NOTE — ED Provider Notes (Signed)
HPI This is a 53 year old female who came in today after swelling that began on her lower lip after taking a new medication. She denies any tongue swelling throat swelling or difficulty breathing. It began approximately an hour prior to my evaluation. She came in secondary to this. She has not noticed any similar events in the past. She has recently started taking lisinopril for her high blood pressure.    Past Medical History   Diagnosis  Date   .  Diabetes mellitus without complication     .  Hypertension     .  GERD (gastroesophageal reflux disease)     .  Neuropathy        Past Surgical History   Procedure  Laterality  Date   .  Shouder surgery       .  Ablation on endometriosis        No family history on file. History   Substance Use Topics   .  Smoking status:  Never Smoker    .  Smokeless tobacco:  Not on file   .  Alcohol Use:  No      OB History      Grav  Para  Term  Preterm  Abortions  TAB  SAB  Ect  Mult  Living                              Review of Systems  All other systems reviewed and are negative.       Allergies   Ace inhibitors    Home Medications      Prior to Admission medications    Medication  Sig  Start Date  End Date  Taking?  Authorizing Provider   esomeprazole (NEXIUM) 40 MG capsule  Take 40 mg by mouth daily at 12 noon.        Historical Provider, MD   gabapentin (NEURONTIN) 300 MG capsule  Take 300 mg by mouth 3 (three) times daily.        Historical Provider, MD   HYDROCHLOROTHIAZIDE PO  Take by mouth.        Historical Provider, MD   HYDROcodone-acetaminophen (NORCO/VICODIN) 5-325 MG per tablet  Take 1-2 tablets by mouth every 4 (four) hours as needed.  05/18/13      Blanchie Dessert, MD   ibuprofen (ADVIL,MOTRIN) 800 MG tablet  Take 1 tablet (800 mg total) by mouth every 8 (eight) hours as needed for mild pain.  06/14/13      Kristen N Ward, DO   lisinopril (PRINIVIL,ZESTRIL) 5 MG tablet  Take 5 mg by mouth daily.        Historical  Provider, MD   loratadine (CLARITIN) 10 MG tablet  Take 10 mg by mouth daily.        Historical Provider, MD   metFORMIN (GLUCOPHAGE) 500 MG tablet  Take by mouth 2 (two) times daily with a meal.        Historical Provider, MD   montelukast (SINGULAIR) 10 MG tablet  Take 10 mg by mouth at bedtime.        Historical Provider, MD   potassium chloride (K-DUR) 10 MEQ tablet  Take 10 mEq by mouth daily.        Historical Provider, MD   propranolol (INDERAL) 20 MG tablet  Take 120 mg by mouth at bedtime.        Historical Provider, MD   traMADol Veatrice Bourbon) 50  MG tablet  Take 1 tablet (50 mg total) by mouth every 6 (six) hours as needed.  05/12/13      Glendell Docker, NP   traMADol (ULTRAM) 50 MG tablet  Take 1 tablet (50 mg total) by mouth every 6 (six) hours as needed.  06/14/13      Kristen N Ward, DO    BP 128/89  Pulse 92  Temp(Src) 98.5 F (36.9 C) (Oral)  Resp 16  Ht 5\' 5"  (1.651 m)  Wt 187 lb (84.823 kg)  BMI 31.12 kg/m2  SpO2 99% Physical Exam  Nursing note and vitals reviewed. Constitutional: She is oriented to person, place, and time. She appears well-developed and well-nourished.  HENT:   Head: Normocephalic and atraumatic.  Right Ear: External ear normal.  Left Ear: External ear normal.   Nose: Nose normal.   Mouth/Throat: Oropharynx is clear and moist.  Swelling noted of the lower right lip.  Eyes: Conjunctivae and EOM are normal. Pupils are equal, round, and reactive to light.  Neck: Normal range of motion. Neck supple.  Cardiovascular: Normal rate, regular rhythm, normal heart sounds and intact distal pulses.   Pulmonary/Chest: Effort normal and breath sounds normal. No respiratory distress. She has no wheezes.  Abdominal: Soft. Bowel sounds are normal.  Musculoskeletal: Normal range of motion.  Neurological: She is alert and oriented to person, place, and time.  Skin: Skin is warm and dry.  Psychiatric: She has a normal mood and affect. Her behavior is normal.      ED  Course   Procedures (including critical care time) Labs Review Labs Reviewed - No data to display  Imaging Review   EKG Interpretation None        Patient observed for several hours and swelling has decreased some. She has had no progression to any time or throat swelling. She is advised against using lisinopril or any ACE inhibitor in the future. She will followup with her primary care Dr. for new antihypertensive prescription.     Shaune Pollack, MD 06/15/13 [phone]      This note is for visit of 06/07/2013  Shaune Pollack, MD 07/03/13 0730

## 2013-08-16 DIAGNOSIS — R29898 Other symptoms and signs involving the musculoskeletal system: Secondary | ICD-10-CM | POA: Insufficient documentation

## 2013-09-03 ENCOUNTER — Emergency Department (HOSPITAL_BASED_OUTPATIENT_CLINIC_OR_DEPARTMENT_OTHER)
Admission: EM | Admit: 2013-09-03 | Discharge: 2013-09-04 | Disposition: A | Discharge status: Home / Self Care | Payer: Self-pay | Attending: Emergency Medicine | Admitting: Emergency Medicine

## 2013-09-03 ENCOUNTER — Emergency Department (HOSPITAL_BASED_OUTPATIENT_CLINIC_OR_DEPARTMENT_OTHER): Payer: Self-pay

## 2013-09-03 ENCOUNTER — Encounter (HOSPITAL_BASED_OUTPATIENT_CLINIC_OR_DEPARTMENT_OTHER): Payer: Self-pay | Admitting: Emergency Medicine

## 2013-09-03 DIAGNOSIS — Y92009 Unspecified place in unspecified non-institutional (private) residence as the place of occurrence of the external cause: Secondary | ICD-10-CM | POA: Insufficient documentation

## 2013-09-03 DIAGNOSIS — K219 Gastro-esophageal reflux disease without esophagitis: Secondary | ICD-10-CM | POA: Insufficient documentation

## 2013-09-03 DIAGNOSIS — M1610 Unilateral primary osteoarthritis, unspecified hip: Secondary | ICD-10-CM | POA: Insufficient documentation

## 2013-09-03 DIAGNOSIS — R259 Unspecified abnormal involuntary movements: Secondary | ICD-10-CM | POA: Insufficient documentation

## 2013-09-03 DIAGNOSIS — G589 Mononeuropathy, unspecified: Secondary | ICD-10-CM | POA: Insufficient documentation

## 2013-09-03 DIAGNOSIS — T148XXA Other injury of unspecified body region, initial encounter: Secondary | ICD-10-CM

## 2013-09-03 DIAGNOSIS — Z79899 Other long term (current) drug therapy: Secondary | ICD-10-CM | POA: Insufficient documentation

## 2013-09-03 DIAGNOSIS — E119 Type 2 diabetes mellitus without complications: Secondary | ICD-10-CM | POA: Insufficient documentation

## 2013-09-03 DIAGNOSIS — S40029A Contusion of unspecified upper arm, initial encounter: Secondary | ICD-10-CM | POA: Insufficient documentation

## 2013-09-03 DIAGNOSIS — W010XXA Fall on same level from slipping, tripping and stumbling without subsequent striking against object, initial encounter: Secondary | ICD-10-CM | POA: Insufficient documentation

## 2013-09-03 DIAGNOSIS — Y9389 Activity, other specified: Secondary | ICD-10-CM | POA: Insufficient documentation

## 2013-09-03 DIAGNOSIS — I10 Essential (primary) hypertension: Secondary | ICD-10-CM | POA: Insufficient documentation

## 2013-09-03 MED ORDER — KETOROLAC TROMETHAMINE 60 MG/2ML IM SOLN
60.0000 mg | Freq: Once | INTRAMUSCULAR | Status: AC
  Administered 2013-09-04: 60 mg via INTRAMUSCULAR
  Filled 2013-09-03: qty 2
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
## 2013-09-03 NOTE — ED Notes (Signed)
Pt states she tripped and fell in her kitchen landing on her right hand/shoulder this morning.  C/o right hand and shoulder pain. Denies any loc. States she has placed ice to arm and has taken ibuprofen pta. No obvious deformity noted on exam. Arm placed in cardboard splint by Shanon Brow, EMT

## 2013-09-04 ENCOUNTER — Encounter (HOSPITAL_BASED_OUTPATIENT_CLINIC_OR_DEPARTMENT_OTHER): Payer: Self-pay | Admitting: Emergency Medicine

## 2013-09-04 MED ORDER — TRAMADOL HCL 50 MG PO TABS: 50.0000 mg | ORAL_TABLET | Freq: Four times a day (QID) | ORAL | Status: DC | PRN

## 2013-09-04 MED ORDER — IBUPROFEN 800 MG PO TABS: 800.0000 mg | ORAL_TABLET | Freq: Three times a day (TID) | ORAL | Status: DC

## 2013-10-01 ENCOUNTER — Emergency Department (HOSPITAL_BASED_OUTPATIENT_CLINIC_OR_DEPARTMENT_OTHER): Payer: Self-pay

## 2013-10-01 ENCOUNTER — Emergency Department (HOSPITAL_BASED_OUTPATIENT_CLINIC_OR_DEPARTMENT_OTHER)
Admission: EM | Admit: 2013-10-01 | Discharge: 2013-10-01 | Disposition: A | Discharge status: Home / Self Care | Payer: Self-pay | Attending: Emergency Medicine | Admitting: Emergency Medicine

## 2013-10-01 ENCOUNTER — Encounter (HOSPITAL_BASED_OUTPATIENT_CLINIC_OR_DEPARTMENT_OTHER): Payer: Self-pay | Admitting: Emergency Medicine

## 2013-10-01 DIAGNOSIS — I10 Essential (primary) hypertension: Secondary | ICD-10-CM | POA: Insufficient documentation

## 2013-10-01 DIAGNOSIS — G608 Other hereditary and idiopathic neuropathies: Secondary | ICD-10-CM | POA: Insufficient documentation

## 2013-10-01 DIAGNOSIS — K219 Gastro-esophageal reflux disease without esophagitis: Secondary | ICD-10-CM | POA: Insufficient documentation

## 2013-10-01 DIAGNOSIS — E119 Type 2 diabetes mellitus without complications: Secondary | ICD-10-CM | POA: Insufficient documentation

## 2013-10-01 DIAGNOSIS — M7989 Other specified soft tissue disorders: Secondary | ICD-10-CM | POA: Insufficient documentation

## 2013-10-01 DIAGNOSIS — G8911 Acute pain due to trauma: Secondary | ICD-10-CM | POA: Insufficient documentation

## 2013-10-01 DIAGNOSIS — Z79899 Other long term (current) drug therapy: Secondary | ICD-10-CM | POA: Insufficient documentation

## 2013-10-01 DIAGNOSIS — M79641 Pain in right hand: Secondary | ICD-10-CM

## 2013-10-01 DIAGNOSIS — M79609 Pain in unspecified limb: Secondary | ICD-10-CM | POA: Insufficient documentation

## 2013-10-01 NOTE — ED Provider Notes (Signed)
History/physical exam/procedure(s) were performed by non-physician practitioner and as supervising physician I was immediately available for consultation/collaboration. I have reviewed all notes and am in agreement with care and plan.   Shaune Pollack, MD 10/01/13 2223

## 2013-10-01 NOTE — ED Notes (Addendum)
Pt states she fell 3 weeks ago hurting her right hand and was seen here for same. States she had a neg xray but states her right hand is not getting better. Describes as a constant pain that is worse with movement. No obvious deformity noted. Pt drove herself to the ED

## 2013-10-01 NOTE — Discharge Instructions (Signed)
Take ibuprofen, 600-800 mg every 6-8 hours as needed for pain.  Musculoskeletal Pain Musculoskeletal pain is muscle and boney aches and pains. These pains can occur in any part of the body. Your caregiver may treat you without knowing the cause of the pain. They may treat you if blood or urine tests, X-rays, and other tests were normal.  CAUSES There is often not a definite cause or reason for these pains. These pains may be caused by a type of germ (virus). The discomfort may also come from overuse. Overuse includes working out too hard when your body is not fit. Boney aches also come from weather changes. Bone is sensitive to atmospheric pressure changes. HOME CARE INSTRUCTIONS   Ask when your test results will be ready. Make sure you get your test results.  Only take over-the-counter or prescription medicines for pain, discomfort, or fever as directed by your caregiver. If you were given medications for your condition, do not drive, operate machinery or power tools, or sign legal documents for 24 hours. Do not drink alcohol. Do not take sleeping pills or other medications that may interfere with treatment.  Continue all activities unless the activities cause more pain. When the pain lessens, slowly resume normal activities. Gradually increase the intensity and duration of the activities or exercise.  During periods of severe pain, bed rest may be helpful. Lay or sit in any position that is comfortable.  Putting ice on the injured area.  Put ice in a bag.  Place a towel between your skin and the bag.  Leave the ice on for 15 to 20 minutes, 3 to 4 times a day.  Follow up with your caregiver for continued problems and no reason can be found for the pain. If the pain becomes worse or does not go away, it may be necessary to repeat tests or do additional testing. Your caregiver may need to look further for a possible cause. SEEK IMMEDIATE MEDICAL CARE IF:  You have pain that is getting worse  and is not relieved by medications.  You develop chest pain that is associated with shortness or breath, sweating, feeling sick to your stomach (nauseous), or throw up (vomit).  Your pain becomes localized to the abdomen.  You develop any new symptoms that seem different or that concern you. MAKE SURE YOU:   Understand these instructions.  Will watch your condition.  Will get help right away if you are not doing well or get worse. Document Released: 02/03/2005 Document Revised: 04/28/2011 Document Reviewed: 10/08/2012 [facility] Patient Information 2015 [place], [place]. This information is not intended to replace advice given to you by your health care provider. Make sure you discuss any questions you have with your health care provider. RICE: Routine Care for Injuries The routine care of many injuries includes Rest, Ice, Compression, and Elevation (RICE). HOME CARE INSTRUCTIONS  Rest is needed to allow your body to heal. Routine activities can usually be resumed when comfortable. Injured tendons and bones can take up to 6 weeks to heal. Tendons are the cord-like structures that attach muscle to bone.  Ice following an injury helps keep the swelling down and reduces pain.  Put ice in a plastic bag.  Place a towel between your skin and the bag.  Leave the ice on for 15-20 minutes, 3-4 times a day, or as directed by your health care provider. Do this while awake, for the first 24 to 48 hours. After that, continue as directed by your caregiver.  Compression helps keep swelling down. It also gives support and helps with discomfort. If an elastic bandage has been applied, it should be removed and reapplied every 3 to 4 hours. It should not be applied tightly, but firmly enough to keep swelling down. Watch fingers or toes for swelling, bluish discoloration, coldness, numbness, or excessive pain. If any of these problems occur, remove the bandage and reapply loosely. Contact your caregiver if  these problems continue.  Elevation helps reduce swelling and decreases pain. With extremities, such as the arms, hands, legs, and feet, the injured area should be placed near or above the level of the heart, if possible. SEEK IMMEDIATE MEDICAL CARE IF:  You have persistent pain and swelling.  You develop redness, numbness, or unexpected weakness.  Your symptoms are getting worse rather than improving after several days. These symptoms may indicate that further evaluation or further X-rays are needed. Sometimes, X-rays may not show a small broken bone (fracture) until 1 week or 10 days later. Make a follow-up appointment with your caregiver. Ask when your X-ray results will be ready. Make sure you get your X-ray results. Document Released: 05/18/2000 Document Revised: 02/08/2013 Document Reviewed: 07/05/2010 [facility] Patient Information 2015 [place], [place]. This information is not intended to replace advice given to you by your health care provider. Make sure you discuss any questions you have with your health care provider.

## 2013-10-01 NOTE — ED Provider Notes (Signed)
CSN: [id number]     Arrival date & time 10/01/13  1912 History   First MD Initiated Contact with Patient 10/01/13 1922     Chief Complaint  Patient presents with  . Hand Pain     (Consider location/radiation/quality/duration/timing/severity/associated sxs/prior Treatment) HPI Comments: Patient is a 53 year old female who presents to the emergency department complaining of continued right hand pain x3 weeks. Patient reports she fell 3 weeks ago landing on her right hand and was evaluated in the emergency department. She had x-rays at that time which were negative, however states her pain is still present with associated swelling. Pain is constant, worse with certain movements, making a fist and using scissors rated "beyond 10 out of 10". She was prescribed Motrin, tramadol and Vicodin, states she is out of tramadol and Vicodin, however if the Motrin does not help with her pain. No new injury or trauma.  Patient is a 53 y.o. female presenting with hand pain. The history is provided by the patient.  Hand Pain    Past Medical History  Diagnosis Date  . Diabetes mellitus without complication   . Hypertension   . GERD (gastroesophageal reflux disease)   . Neuropathy    Past Surgical History  Procedure Laterality Date  . Shouder surgery    . Ablation on endometriosis     No family history on file. History  Substance Use Topics  . Smoking status: Never Smoker   . Smokeless tobacco: Not on file  . Alcohol Use: No   OB History   Grav Para Term Preterm Abortions TAB SAB Ect Mult Living                 Review of Systems  Musculoskeletal:       + R hand and swelling.  All other systems reviewed and are negative.     Allergies  Ace inhibitors  Home Medications   Prior to Admission medications   Medication Sig Start Date End Date Taking? Authorizing Provider  esomeprazole (NEXIUM) 40 MG capsule Take 40 mg by mouth daily at 12 noon.    Historical Provider, MD  gabapentin  (NEURONTIN) 300 MG capsule Take 300 mg by mouth 3 (three) times daily.    Historical Provider, MD  HYDROCHLOROTHIAZIDE PO Take by mouth.    Historical Provider, MD  HYDROcodone-acetaminophen (NORCO/VICODIN) 5-325 MG per tablet Take 1-2 tablets by mouth every 4 (four) hours as needed. 05/18/13   Blanchie Dessert, MD  ibuprofen (ADVIL,MOTRIN) 800 MG tablet Take 1 tablet (800 mg total) by mouth every 8 (eight) hours as needed for mild pain. 06/14/13   Kristen N Ward, DO  ibuprofen (ADVIL,MOTRIN) 800 MG tablet Take 1 tablet (800 mg total) by mouth 3 (three) times daily. 09/04/13   April K Palumbo-Rasch, MD  lisinopril (PRINIVIL,ZESTRIL) 5 MG tablet Take 5 mg by mouth daily.    Historical Provider, MD  loratadine (CLARITIN) 10 MG tablet Take 10 mg by mouth daily.    Historical Provider, MD  metFORMIN (GLUCOPHAGE) 500 MG tablet Take by mouth 2 (two) times daily with a meal.    Historical Provider, MD  montelukast (SINGULAIR) 10 MG tablet Take 10 mg by mouth at bedtime.    Historical Provider, MD  potassium chloride (K-DUR) 10 MEQ tablet Take 10 mEq by mouth daily.    Historical Provider, MD  propranolol (INDERAL) 20 MG tablet Take 120 mg by mouth at bedtime.    Historical Provider, MD  traMADol (ULTRAM) 50 MG tablet Take 1  tablet (50 mg total) by mouth every 6 (six) hours as needed. 05/12/13   Glendell Docker, NP  traMADol (ULTRAM) 50 MG tablet Take 1 tablet (50 mg total) by mouth every 6 (six) hours as needed. 06/14/13   Kristen N Ward, DO  traMADol (ULTRAM) 50 MG tablet Take 1 tablet (50 mg total) by mouth every 6 (six) hours as needed. 09/04/13   April K Palumbo-Rasch, MD   BP 151/93  Pulse 73  Temp(Src) 98.1 F (36.7 C) (Oral)  Resp 18  Ht 5\' 5"  (1.651 m)  Wt 200 lb (90.719 kg)  BMI 33.28 kg/m2  SpO2 100% Physical Exam  Nursing note and vitals reviewed. Constitutional: She is oriented to person, place, and time. She appears well-developed and well-nourished. No distress.  HENT:  Head:  Normocephalic and atraumatic.  Mouth/Throat: Oropharynx is clear and moist.  Eyes: Conjunctivae are normal.  Neck: Normal range of motion. Neck supple.  Cardiovascular: Normal rate, regular rhythm, normal heart sounds and intact distal pulses.   Pulmonary/Chest: Effort normal and breath sounds normal.  Musculoskeletal:  Right hand tender to palpation over second through fourth MCPs and distal metacarpals. Small amount of swelling noted over second and third MCP. Patient unwilling to make a full fist due to pain. No deformity or bruising.  Neurological: She is alert and oriented to person, place, and time.  Skin: Skin is warm and dry. She is not diaphoretic.  Cap refill < 3 seconds.  Psychiatric: She has a normal mood and affect. Her behavior is normal.    ED Course  Procedures (including critical care time) Labs Review Labs Reviewed - No data to display  Imaging Review Dg Hand Complete Right  10/01/2013   CLINICAL DATA:  Fall.  Right hand pain.  EXAM: RIGHT HAND - COMPLETE 3+ VIEW  COMPARISON:  09/03/2013  FINDINGS: No acute bony abnormality. Specifically, no fracture, subluxation, or dislocation. Soft tissues are intact. Mild joint space narrowing and early osteoarthritic changes in the DIP joints.  IMPRESSION: No acute bony abnormality.   Electronically Signed   By: Rolm Baptise M.D.   On: 10/01/2013 19:49     EKG Interpretation None      MDM   Final diagnoses:  Right hand pain    Patient is in for reevaluation of her right hand injury. Neurovascularly intact. She is well appearing and in no apparent distress afebrile, vital signs stable. No new injury or trauma. Repeat x-ray without any acute findings. Ace wrap applied. I discussed RICE, NSAIDs. Followup with orthopedics if no improvement. Stable for discharge. Return precautions given. Patient states understanding of treatment care plan and is agreeable.   Illene Labrador, PA-C 10/01/13 2033

## 2013-10-03 ENCOUNTER — Encounter: Payer: Self-pay | Admitting: Family Medicine

## 2013-10-03 ENCOUNTER — Ambulatory Visit (INDEPENDENT_AMBULATORY_CARE_PROVIDER_SITE_OTHER): Payer: Self-pay | Admitting: Family Medicine

## 2013-10-03 VITALS — BP 128/84 | HR 80 | Ht 65.0 in | Wt 200.0 lb

## 2013-10-03 DIAGNOSIS — S6990XA Unspecified injury of unspecified wrist, hand and finger(s), initial encounter: Secondary | ICD-10-CM

## 2013-10-03 DIAGNOSIS — M79609 Pain in unspecified limb: Secondary | ICD-10-CM

## 2013-10-03 DIAGNOSIS — M79641 Pain in right hand: Secondary | ICD-10-CM

## 2013-10-03 DIAGNOSIS — S6991XA Unspecified injury of right wrist, hand and finger(s), initial encounter: Secondary | ICD-10-CM

## 2013-10-03 MED ORDER — OXYCODONE-ACETAMINOPHEN 5-325 MG PO TABS: 1.0000 | ORAL_TABLET | Freq: Four times a day (QID) | ORAL | Status: DC | PRN

## 2013-10-03 NOTE — Patient Instructions (Addendum)
Wear wrist splint as often as possible (or wrist brace with buddy taping). Ok to take off to cleanse the area, ice it. Icing 15 minutes at a time 3-4 times a day as needed. Percocet as needed for severe pain (no driving on this) - we do not refill this medication though. Call me when the coverage goes through so we can go ahead with an MRI.

## 2013-10-05 ENCOUNTER — Encounter: Payer: Self-pay | Admitting: Family Medicine

## 2013-10-05 DIAGNOSIS — S6991XA Unspecified injury of right wrist, hand and finger(s), initial encounter: Secondary | ICD-10-CM | POA: Insufficient documentation

## 2013-10-05 NOTE — Assessment & Plan Note (Signed)
brief MSK u/s did not show a fracture or obvious tendon tear.  Radiographs x 2 negative.  Suspect severe contusion but would expect improvement at this point - still having 8/10 level pain.  Icing, percocet if needed.  Wrist brace with buddy taping for support and immobilization.  Call when cone coverage goes through for an update on her status.  Could consider MRI at that time if still in severe pain to assess for partial tendon tear or physical therapy.

## 2013-10-05 NOTE — Progress Notes (Signed)
Patient ID: [name], female   DOB: 12/05/1960, 53 y.o.   MRN: [id number]  PCP: No PCP Per Patient  Subjective:   HPI: Patient is a 53 y.o. female here for right hand injury.  Patient reports 3 weeks ago she fell and hit right hand against cabinet. Unsure what part of hand struck it or if wrist bent a certain direction. + swelling and a lot of pain. Radiographs initially and when repeated were negative for fracture. Unable to use right hand still. Using a wrap but not a brace or splint. Is right handed. No prior issues.  Past Medical History  Diagnosis Date  . Diabetes mellitus without complication   . Hypertension   . GERD (gastroesophageal reflux disease)   . Neuropathy     Current Outpatient Prescriptions on File Prior to Visit  Medication Sig Dispense Refill  . esomeprazole (NEXIUM) 40 MG capsule Take 40 mg by mouth daily at 12 noon.      . gabapentin (NEURONTIN) 300 MG capsule Take 300 mg by mouth 3 (three) times daily.      Marland Kitchen HYDROCHLOROTHIAZIDE PO Take by mouth.      . metFORMIN (GLUCOPHAGE) 500 MG tablet Take by mouth 2 (two) times daily with a meal.      . potassium chloride (K-DUR) 10 MEQ tablet Take 10 mEq by mouth daily.      Marland Kitchen ibuprofen (ADVIL,MOTRIN) 800 MG tablet Take 1 tablet (800 mg total) by mouth 3 (three) times daily.  21 tablet  0  . lisinopril (PRINIVIL,ZESTRIL) 5 MG tablet Take 5 mg by mouth daily.      Marland Kitchen loratadine (CLARITIN) 10 MG tablet Take 10 mg by mouth daily.      . montelukast (SINGULAIR) 10 MG tablet Take 10 mg by mouth at bedtime.      . propranolol (INDERAL) 20 MG tablet Take 120 mg by mouth at bedtime.       No current facility-administered medications on file prior to visit.    Past Surgical History  Procedure Laterality Date  . Shouder surgery    . Ablation on endometriosis      Allergies  Allergen Reactions  . Ace Inhibitors     swelling    History   Social History  . Marital Status: Divorced    Spouse Name: N/A   Number of Children: N/A  . Years of Education: N/A   Occupational History  . Not on file.   Social History Main Topics  . Smoking status: Never Smoker   . Smokeless tobacco: Not on file  . Alcohol Use: No  . Drug Use: No  . Sexual Activity: Yes    Birth Control/ Protection: None   Other Topics Concern  . Not on file   Social History Narrative  . No narrative on file    History reviewed. No pertinent family history.  BP 128/84  Pulse 80  Ht 5\' 5"  (1.651 m)  Wt 200 lb (90.719 kg)  BMI 33.28 kg/m2  Review of Systems: See HPI above.    Objective:  Physical Exam:  Gen: NAD  Right hand: Minimal swelling dorsal hand.  No bruising, other deformity. No malrotation, angulation of digits. TTP mainly 4th, 5th metacarpals and proximal phalanges.  No snuffbox, other wrist/hand tenderness. Able to flex and extend with 5/5 strength at 4th and 5th MCPs, PIPs, DIPs. Collateral ligaments intact as well. NVI distally.    Assessment & Plan:  1. Right hand injury - brief MSK  u/s did not show a fracture or obvious tendon tear.  Radiographs x 2 negative.  Suspect severe contusion but would expect improvement at this point - still having 8/10 level pain.  Icing, percocet if needed.  Wrist brace with buddy taping for support and immobilization.  Call when cone coverage goes through for an update on her status.  Could consider MRI at that time if still in severe pain to assess for partial tendon tear or physical therapy.

## 2013-10-27 ENCOUNTER — Ambulatory Visit: Payer: Self-pay

## 2013-10-31 ENCOUNTER — Encounter: Payer: Self-pay | Admitting: Family Medicine

## 2013-10-31 ENCOUNTER — Ambulatory Visit (INDEPENDENT_AMBULATORY_CARE_PROVIDER_SITE_OTHER): Payer: No Typology Code available for payment source | Admitting: Family Medicine

## 2013-10-31 VITALS — BP 112/66 | HR 99 | Ht 65.0 in | Wt 200.0 lb

## 2013-10-31 DIAGNOSIS — S6990XA Unspecified injury of unspecified wrist, hand and finger(s), initial encounter: Secondary | ICD-10-CM

## 2013-10-31 DIAGNOSIS — Z5189 Encounter for other specified aftercare: Secondary | ICD-10-CM

## 2013-10-31 DIAGNOSIS — S6991XD Unspecified injury of right wrist, hand and finger(s), subsequent encounter: Secondary | ICD-10-CM

## 2013-11-01 ENCOUNTER — Encounter: Payer: Self-pay | Admitting: Family Medicine

## 2013-11-01 NOTE — Assessment & Plan Note (Signed)
Radiographs and MSK u/s were reassuring but she continues to struggle with pain following blunt trauma to dorsal aspect of right hand.  She has burning, neuropathy of left wrist and into right leg as well.  She has a neurologist who wanted to order NCVs but isn't certain where she could have them with [facility] coverage.  We will go ahead with MRI of right hand to assess for tendon injury.  We could also order the NCV/EMGs provided we have the neurology records - I suspect if her MRI is normal this may be due to a polyneuropathy.

## 2013-11-01 NOTE — Progress Notes (Addendum)
Patient ID: [name], female   DOB: 03-06-1960, 53 y.o.   MRN: [id number]  PCP: No PCP Per Patient  Subjective:   HPI: Patient is a 53 y.o. female here for right hand injury.  8/17: Patient reports 3 weeks ago she fell and hit right hand against cabinet. Unsure what part of hand struck it or if wrist bent a certain direction. + swelling and a lot of pain. Radiographs initially and when repeated were negative for fracture. Unable to use right hand still. Using a wrap but not a brace or splint. Is right handed. No prior issues.  9/14: Patient reports pain has not improved in right hand. Some swelling as well. Pain worse at nighttime. Has been buddy taping 4th and 5th digits together, wrist brace. She has a neurologist as well for neuropathy - has not been on her gabapentin. Getting a burning sensation ulnar side of left wrist as well without injury.  Past Medical History  Diagnosis Date  . Diabetes mellitus without complication   . Hypertension   . GERD (gastroesophageal reflux disease)   . Neuropathy     Current Outpatient Prescriptions on File Prior to Visit  Medication Sig Dispense Refill  . esomeprazole (NEXIUM) 40 MG capsule Take 40 mg by mouth daily at 12 noon.      . gabapentin (NEURONTIN) 300 MG capsule Take 300 mg by mouth 3 (three) times daily.      Marland Kitchen HYDROCHLOROTHIAZIDE PO Take by mouth.      Marland Kitchen ibuprofen (ADVIL,MOTRIN) 800 MG tablet Take 1 tablet (800 mg total) by mouth 3 (three) times daily.  21 tablet  0  . lisinopril (PRINIVIL,ZESTRIL) 5 MG tablet Take 5 mg by mouth daily.      Marland Kitchen loratadine (CLARITIN) 10 MG tablet Take 10 mg by mouth daily.      . metFORMIN (GLUCOPHAGE) 500 MG tablet Take by mouth 2 (two) times daily with a meal.      . montelukast (SINGULAIR) 10 MG tablet Take 10 mg by mouth at bedtime.      Marland Kitchen oxyCODONE-acetaminophen (PERCOCET/ROXICET) 5-325 MG per tablet Take 1 tablet by mouth every 6 (six) hours as needed for severe pain.  40 tablet   0  . potassium chloride (K-DUR) 10 MEQ tablet Take 10 mEq by mouth daily.      . propranolol (INDERAL) 20 MG tablet Take 120 mg by mouth at bedtime.       No current facility-administered medications on file prior to visit.    Past Surgical History  Procedure Laterality Date  . Shouder surgery    . Ablation on endometriosis      Allergies  Allergen Reactions  . Ace Inhibitors     swelling    History   Social History  . Marital Status: Divorced    Spouse Name: N/A    Number of Children: N/A  . Years of Education: N/A   Occupational History  . Not on file.   Social History Main Topics  . Smoking status: Never Smoker   . Smokeless tobacco: Not on file  . Alcohol Use: No  . Drug Use: No  . Sexual Activity: Yes    Birth Control/ Protection: None   Other Topics Concern  . Not on file   Social History Narrative  . No narrative on file    No family history on file.  BP 112/66  Pulse 99  Ht 5\' 5"  (1.651 m)  Wt 200 lb (90.719 kg)  BMI 33.28 kg/m2  Review of Systems: See HPI above.    Objective:  Physical Exam:  Gen: NAD  Right hand: Minimal swelling dorsal hand.  No bruising, other deformity. No malrotation, angulation of digits. TTP mainly 4th, 5th metacarpals and proximal phalanges.  No snuffbox, other wrist/hand tenderness. Able to flex and extend with 5/5 strength at 4th and 5th MCPs, PIPs, DIPs. Collateral ligaments intact as well. NVI distally.    Assessment & Plan:  1. Right hand injury - Radiographs and MSK u/s were reassuring but she continues to struggle with pain following blunt trauma to dorsal aspect of right hand.  She has burning, neuropathy of left wrist and into right leg as well.  She has a neurologist who wanted to order NCVs but isn't certain where she could have them with [facility] coverage.  We will go ahead with MRI of right hand to assess for tendon injury.  We could also order the NCV/EMGs provided we have the neurology records - I  suspect if her MRI is normal this may be due to a polyneuropathy.    Addendum:  MRI reviewed and discussed with patient.  She does have tenosynovitis mainly of 4th digit extensor tendons but also of 3rd digit.  Very small intrasubstance extensor tendon tear.  Typically this should improve with NSAIDs plus physical therapy.  She states it's very difficult to even touch right hand - burning and has similar symptoms right leg and left wrist.  I suspect she has two things going on - neuropathy along with this tenosynovitis.  States she does not want to do physical therapy at this time.  She is back on her gabapentin.  Options limited with [employer] coverage - could not get her in with another neurologist or hand surgeon at this time.  She has applied at [facility] for their coverage and hopefully can see a specialist when this goes through.

## 2013-11-02 ENCOUNTER — Ambulatory Visit (HOSPITAL_BASED_OUTPATIENT_CLINIC_OR_DEPARTMENT_OTHER)
Admission: RE | Admit: 2013-11-02 | Discharge: 2013-11-02 | Disposition: A | Discharge status: Home / Self Care | Payer: No Typology Code available for payment source | Source: Ambulatory Visit | Attending: Family Medicine | Admitting: Family Medicine

## 2013-11-02 DIAGNOSIS — IMO0002 Reserved for concepts with insufficient information to code with codable children: Secondary | ICD-10-CM | POA: Insufficient documentation

## 2013-11-02 DIAGNOSIS — M65849 Other synovitis and tenosynovitis, unspecified hand: Secondary | ICD-10-CM

## 2013-11-02 DIAGNOSIS — S66819A Strain of other specified muscles, fascia and tendons at wrist and hand level, unspecified hand, initial encounter: Principal | ICD-10-CM

## 2013-11-02 DIAGNOSIS — M7989 Other specified soft tissue disorders: Secondary | ICD-10-CM | POA: Insufficient documentation

## 2013-11-02 DIAGNOSIS — M65839 Other synovitis and tenosynovitis, unspecified forearm: Secondary | ICD-10-CM | POA: Insufficient documentation

## 2013-11-02 DIAGNOSIS — X58XXXA Exposure to other specified factors, initial encounter: Secondary | ICD-10-CM | POA: Insufficient documentation

## 2013-11-02 DIAGNOSIS — S6991XD Unspecified injury of right wrist, hand and finger(s), subsequent encounter: Secondary | ICD-10-CM

## 2013-11-02 DIAGNOSIS — S638X9A Sprain of other part of unspecified wrist and hand, initial encounter: Principal | ICD-10-CM

## 2014-01-24 DIAGNOSIS — D369 Benign neoplasm, unspecified site: Secondary | ICD-10-CM | POA: Insufficient documentation

## 2014-01-31 DIAGNOSIS — K219 Gastro-esophageal reflux disease without esophagitis: Secondary | ICD-10-CM | POA: Insufficient documentation

## 2014-01-31 DIAGNOSIS — I10 Essential (primary) hypertension: Secondary | ICD-10-CM | POA: Insufficient documentation

## 2014-01-31 DIAGNOSIS — J45909 Unspecified asthma, uncomplicated: Secondary | ICD-10-CM | POA: Insufficient documentation

## 2014-01-31 DIAGNOSIS — H409 Unspecified glaucoma: Secondary | ICD-10-CM | POA: Insufficient documentation

## 2014-01-31 DIAGNOSIS — E119 Type 2 diabetes mellitus without complications: Secondary | ICD-10-CM | POA: Insufficient documentation

## 2014-02-10 ENCOUNTER — Encounter (HOSPITAL_BASED_OUTPATIENT_CLINIC_OR_DEPARTMENT_OTHER): Payer: Self-pay | Admitting: Emergency Medicine

## 2014-02-10 ENCOUNTER — Emergency Department (HOSPITAL_BASED_OUTPATIENT_CLINIC_OR_DEPARTMENT_OTHER)
Admission: EM | Admit: 2014-02-10 | Discharge: 2014-02-10 | Disposition: A | Discharge status: Home / Self Care | Payer: Medicaid Other | Attending: Emergency Medicine | Admitting: Emergency Medicine

## 2014-02-10 DIAGNOSIS — G629 Polyneuropathy, unspecified: Secondary | ICD-10-CM | POA: Diagnosis not present

## 2014-02-10 DIAGNOSIS — E119 Type 2 diabetes mellitus without complications: Secondary | ICD-10-CM | POA: Diagnosis not present

## 2014-02-10 DIAGNOSIS — Y838 Other surgical procedures as the cause of abnormal reaction of the patient, or of later complication, without mention of misadventure at the time of the procedure: Secondary | ICD-10-CM | POA: Diagnosis not present

## 2014-02-10 DIAGNOSIS — I10 Essential (primary) hypertension: Secondary | ICD-10-CM | POA: Diagnosis not present

## 2014-02-10 DIAGNOSIS — Z791 Long term (current) use of non-steroidal anti-inflammatories (NSAID): Secondary | ICD-10-CM | POA: Diagnosis not present

## 2014-02-10 DIAGNOSIS — K122 Cellulitis and abscess of mouth: Secondary | ICD-10-CM

## 2014-02-10 DIAGNOSIS — T814XXA Infection following a procedure, initial encounter: Secondary | ICD-10-CM | POA: Insufficient documentation

## 2014-02-10 DIAGNOSIS — K219 Gastro-esophageal reflux disease without esophagitis: Secondary | ICD-10-CM | POA: Insufficient documentation

## 2014-02-10 DIAGNOSIS — K1379 Other lesions of oral mucosa: Secondary | ICD-10-CM | POA: Diagnosis present

## 2014-02-10 LAB — BASIC METABOLIC PANEL
Anion gap: 11 (ref 5–15)
BUN: 16 mg/dL (ref 6–23)
CO2: 29 mmol/L (ref 19–32)
Calcium: 9.5 mg/dL (ref 8.4–10.5)
Chloride: 101 mEq/L (ref 96–112)
Creatinine, Ser: 0.75 mg/dL (ref 0.50–1.10)
GFR calc Af Amer: >90 mL/min (ref >90)
GFR calc non Af Amer: >90 mL/min (ref >90)
GLUCOSE: 91 mg/dL (ref 70–99)
POTASSIUM: 3.2 mmol/L — AB (ref 3.5–5.1)
Sodium: 141 mmol/L (ref 135–145)

## 2014-02-10 LAB — CBC WITH DIFFERENTIAL/PLATELET
BASOS PCT: 0 % (ref 0–1)
Basophils Absolute: 0 10*3/uL (ref 0.0–0.1)
EOS PCT: 1 % (ref 0–5)
Eosinophils Absolute: 0.1 10*3/uL (ref 0.0–0.7)
HCT: 43.1 % (ref 36.0–46.0)
HEMOGLOBIN: 14.7 g/dL (ref 12.0–15.0)
LYMPHS ABS: 2.1 10*3/uL (ref 0.7–4.0)
Lymphocytes Relative: 37 % (ref 12–46)
MCH: 31.1 pg (ref 26.0–34.0)
MCHC: 34.1 g/dL (ref 30.0–36.0)
MCV: 91.1 fL (ref 78.0–100.0)
MONOS PCT: 8 % (ref 3–12)
Monocytes Absolute: 0.5 10*3/uL (ref 0.1–1.0)
Neutro Abs: 3.1 10*3/uL (ref 1.7–7.7)
Neutrophils Relative %: 54 % (ref 43–77)
PLATELETS: 190 10*3/uL (ref 150–400)
RBC: 4.73 MIL/uL (ref 3.87–5.11)
RDW: 12 % (ref 11.5–15.5)
WBC: 5.8 10*3/uL (ref 4.0–10.5)

## 2014-02-10 LAB — CBG MONITORING, ED: Glucose-Capillary: 75 mg/dL (ref 70–99)

## 2014-02-10 MED ORDER — MORPHINE SULFATE 4 MG/ML IJ SOLN
4.0000 mg | Freq: Once | INTRAMUSCULAR | Status: AC
Start: 2014-02-10 — End: 2014-02-10
  Administered 2014-02-10: 4 mg via INTRAVENOUS
  Filled 2014-02-10: qty 1

## 2014-02-10 MED ORDER — PENICILLIN V POTASSIUM 500 MG PO TABS: 500.0000 mg | ORAL_TABLET | Freq: Four times a day (QID) | ORAL | Status: AC

## 2014-02-10 MED ORDER — CLINDAMYCIN HCL 150 MG PO CAPS: 450.0000 mg | ORAL_CAPSULE | Freq: Three times a day (TID) | ORAL | Status: DC

## 2014-02-10 MED ORDER — SODIUM CHLORIDE 0.9 % IV BOLUS (SEPSIS)
1000.0000 mL | Freq: Once | INTRAVENOUS | Status: AC
  Administered 2014-02-10: 1000 mL via INTRAVENOUS

## 2014-02-10 MED ORDER — HYDROCODONE-ACETAMINOPHEN 7.5-325 MG/15ML PO SOLN: 15.0000 mL | Freq: Four times a day (QID) | ORAL | Status: DC | PRN

## 2014-02-10 NOTE — ED Notes (Signed)
Pt states she had surgery 8 days ago on her mouth at [facility].  Pt is having pain and difficulty eating.  Pt had called surgeon and prescription was called in but cost $150, so pt unable to afford this.

## 2014-02-10 NOTE — ED Provider Notes (Signed)
CSN: [id number]     Arrival date & time 02/10/14  1611 History   First MD Initiated Contact with Patient 02/10/14 1718     Chief Complaint  Patient presents with  . Oral Pain  . Post-op Problem     (Consider location/radiation/quality/duration/timing/severity/associated sxs/prior Treatment) HPI Comments: Patient presents today with a chief complaint of pain of the roof of her mouth.  She had resection of a mass on her hard palate performed at [facility] on 02/02/14.  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  She states that she called the surgeon that did the surgery and was called in a Rx for pain medication.  However, the medication was 150 dollars to fill so she did not get it filled.  She states that she currently is not taking anything for pain.  She reports difficulty swallowing due to the pain, but states that this has also been present since the surgery.  She reports that she is physically able to swallow, it is just painful.  She denies fever, chills, nausea, vomiting, or difficulty breathing.    The history is provided by the patient.    Past Medical History  Diagnosis Date  . Diabetes mellitus without complication   . Hypertension   . GERD (gastroesophageal reflux disease)   . Neuropathy    Past Surgical History  Procedure Laterality Date  . Shouder surgery    . Ablation on endometriosis    . Mouth surgery      tumor removed.   No family history on file. History  Substance Use Topics  . Smoking status: Never Smoker   . Smokeless tobacco: Not on file  . Alcohol Use: No   OB History    No data available     Review of Systems  All other systems reviewed and are negative.     Allergies  Ace inhibitors  Home Medications   Prior to Admission medications   Medication Sig Start Date End Date Taking? Authorizing Provider  celecoxib (CELEBREX) 200 MG capsule Take 200 mg by mouth daily.    Yes Historical Provider, MD  esomeprazole (NEXIUM) 40 MG capsule Take 40 mg by mouth daily at 12 noon.   Yes Historical Provider, MD  gabapentin (NEURONTIN) 300 MG capsule Take 300 mg by mouth 3 (three) times daily.   Yes Historical Provider, MD  HYDROCHLOROTHIAZIDE PO Take 25 mg by mouth.    Yes Historical Provider, MD  HYDROcodone-acetaminophen (NORCO) 10-325 MG per tablet Take 1 tablet by mouth every 6 (six) hours as needed.   Yes Historical Provider, MD  loratadine (CLARITIN) 10 MG tablet Take 10 mg by mouth daily.   Yes Historical Provider, MD  metaxalone (SKELAXIN) 800 MG tablet Take 800 mg by mouth every morning.   Yes Historical Provider, MD  metFORMIN (GLUCOPHAGE) 500 MG tablet Take by mouth 2 (two) times daily with a meal.   Yes Historical Provider, MD  ondansetron (ZOFRAN) 4 MG tablet Take 4 mg by mouth every 6 (six) hours as needed for nausea or vomiting.   Yes Historical Provider, MD  potassium chloride (K-DUR) 10 MEQ tablet Take 10 mEq by mouth daily.   Yes Historical Provider, MD  promethazine (PHENERGAN) 25 MG tablet Take 25 mg by mouth every 6 (six) hours as needed for nausea or vomiting.   Yes Historical Provider, MD  rizatriptan (MAXALT) 10 MG tablet Take 10 mg by mouth  as needed for migraine. May repeat in 2 hours if needed   Yes Historical Provider, MD  rosuvastatin (CRESTOR) 40 MG tablet Take 40 mg by mouth daily.   Yes Historical Provider, MD  timolol (BETIMOL) 0.25 % ophthalmic solution 1-2 drops 2 (two) times daily.   Yes Historical Provider, MD  ibuprofen (ADVIL,MOTRIN) 800 MG tablet Take 1 tablet (800 mg total) by mouth 3 (three) times daily. 09/04/13   April Alfonso Patten, MD  oxyCODONE-acetaminophen (PERCOCET/ROXICET) 5-325 MG per tablet Take 1 tablet by mouth every 6 (six) hours as needed for severe pain. 10/03/13   Dene Gentry, MD   BP 161/95 mmHg  Pulse 95  Temp(Src) 98.1 F (36.7 C) (Oral)  Resp 16  Wt 200 lb (90.719 kg)  SpO2 100% Physical Exam   Constitutional: She appears well-developed and well-nourished.  HENT:  Head: Normocephalic and atraumatic.  Mouth/Throat: No trismus in the jaw.  Approximately 2 cm ulcerated post surgical wound of the upper palate of the mouth with yellowish colored crusting.  No surrounding edema or erythema. No bleeding.  Oropharynx patent.  Handling secretions well.  No drooling.    Neck: Normal range of motion. Neck supple.  Cardiovascular: Normal rate and normal heart sounds.   Pulmonary/Chest: Effort normal and breath sounds normal. No respiratory distress.  Abdominal: Soft. Bowel sounds are normal.  Musculoskeletal: Normal range of motion.  Neurological: She is alert.  Skin: Skin is warm and dry.  Psychiatric: She has a normal mood and affect.  Nursing note and vitals reviewed.   ED Course  Procedures (including critical care time) Labs Review Labs Reviewed  BASIC METABOLIC PANEL - Abnormal; Notable for the following:    Potassium 3.2 (*)    All other components within normal limits  CBC WITH DIFFERENTIAL  CBG MONITORING, ED    Imaging Review No results found.   EKG Interpretation None      MDM   Final diagnoses:  None   Patient presents today with pain of her hard palate.  She had surgical resection of a mass on the hard palate performed at [facility] on 01/31/14.  She states that her Surgeon called in a Rx for liquid pain medication.  However, she was unable to get it filled due to the price.  Today the wound appears to look infected.  Patient is afebrile.  VSS.  No leukocytosis.  Patient treated with Clindamycin.  Patient informed that Clindamycin would be the preferred treatment.  However, if the Clindamycin is too expensive and she can not afford it than she was also given a Rx for PCN to fill if the Clindamycin is not affordable.  Patient able to tolerate PO liquids.  Oropharynx is widely patent.  Patient stable for discharge.  Instructed to follow up with the Surgeon that performed  the Surgery.  Strict return precautions given.    Hyman Bible, PA-C 02/12/14 [place], MD 02/12/14 [phone]

## 2014-02-10 NOTE — ED Notes (Signed)
In to start IV access on patient - pt moving about, upset, pt jerking arms during attempt, unsuccessful x2 attempts, EDP notified, another RN to attempt.

## 2014-02-10 NOTE — ED Notes (Signed)
Labs drawn with IV start and sent to lab.

## 2014-02-10 NOTE — Discharge Instructions (Signed)
I would recommend taking Clindamycin for the infection.  However, if this medication is too expensive, you can instead get the Penicillin filled and take this.  Follow up with your Surgeon.  Return to the Emergency Department if symptoms change or worsen.  Take Hycet as needed for pain.  Do not drive or operate heavy machinery for 4-6 hours after taking medication.

## 2014-03-10 DIAGNOSIS — Z1239 Encounter for other screening for malignant neoplasm of breast: Secondary | ICD-10-CM | POA: Insufficient documentation

## 2014-03-10 DIAGNOSIS — Z124 Encounter for screening for malignant neoplasm of cervix: Secondary | ICD-10-CM | POA: Insufficient documentation

## 2014-03-29 ENCOUNTER — Ambulatory Visit (INDEPENDENT_AMBULATORY_CARE_PROVIDER_SITE_OTHER): Payer: No Typology Code available for payment source | Admitting: Family Medicine

## 2014-03-29 ENCOUNTER — Encounter (INDEPENDENT_AMBULATORY_CARE_PROVIDER_SITE_OTHER): Payer: Self-pay

## 2014-03-29 ENCOUNTER — Encounter: Payer: Self-pay | Admitting: Family Medicine

## 2014-03-29 VITALS — BP 155/99 | HR 86 | Ht 65.0 in | Wt 195.0 lb

## 2014-03-29 DIAGNOSIS — S6991XD Unspecified injury of right wrist, hand and finger(s), subsequent encounter: Secondary | ICD-10-CM

## 2014-03-29 MED ORDER — TRAMADOL HCL 50 MG PO TABS: 50.0000 mg | ORAL_TABLET | Freq: Four times a day (QID) | ORAL | Status: AC | PRN

## 2014-03-29 MED ORDER — DICLOFENAC SODIUM 75 MG PO TBEC
75.0000 mg | DELAYED_RELEASE_TABLET | Freq: Two times a day (BID) | ORAL | Status: AC
Start: 2014-03-29 — End: ?

## 2014-03-29 NOTE — Patient Instructions (Addendum)
Check into the program at [facility] - if this goes through we can try to refer you to their hand surgery group. Wrist brace if this provides you with benefit. Voltaren twice a day with food for pain and inflammation. Tramadol as needed for severe pain - no driving on this medicine. We can order physical therapy as well if you're willing to try this.

## 2014-04-03 NOTE — Progress Notes (Signed)
Patient ID: [name], female   DOB: 1960/09/26, 54 y.o.   MRN: [id number]  PCP: Marry Guan  Subjective:   HPI: Patient is a 54 y.o. female here for right hand injury.  8/17: Patient reports 3 weeks ago she fell and hit right hand against cabinet. Unsure what part of hand struck it or if wrist bent a certain direction. + swelling and a lot of pain. Radiographs initially and when repeated were negative for fracture. Unable to use right hand still. Using a wrap but not a brace or splint. Is right handed. No prior issues.  9/14: Patient reports pain has not improved in right hand. Some swelling as well. Pain worse at nighttime. Has been buddy taping 4th and 5th digits together, wrist brace. She has a neurologist as well for neuropathy - has not been on her gabapentin. Getting a burning sensation ulnar side of left wrist as well without injury.  03/29/14: Patient reports she continues to struggle with use of her right hand. Difficulty using this. 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 No changes from prior visit.  Past Medical History  Diagnosis Date  . Diabetes mellitus without complication   . Hypertension   . GERD (gastroesophageal reflux disease)   . Neuropathy     Current Outpatient Prescriptions on File Prior to Visit  Medication Sig Dispense Refill  . celecoxib (CELEBREX) 200 MG capsule Take 200 mg by mouth daily.    . clindamycin (CLEOCIN) 150 MG capsule Take 3 capsules (450 mg total) by mouth 3 (three) times daily. 90 capsule 0  . esomeprazole (NEXIUM) 40 MG capsule Take 40 mg by mouth daily at 12 noon.    . gabapentin (NEURONTIN) 300 MG capsule Take 300 mg by mouth 3 (three) times daily.    Marland Kitchen HYDROCHLOROTHIAZIDE PO Take 25 mg by mouth.     Marland Kitchen ibuprofen (ADVIL,MOTRIN) 800 MG tablet Take 1 tablet (800 mg total) by mouth 3 (three) times daily. 21 tablet 0  . loratadine (CLARITIN) 10 MG tablet Take 10 mg by mouth daily.     . metaxalone (SKELAXIN) 800 MG tablet Take 800 mg by mouth every morning.    . metFORMIN (GLUCOPHAGE) 500 MG tablet Take by mouth 2 (two) times daily with a meal.    . ondansetron (ZOFRAN) 4 MG tablet Take 4 mg by mouth every 6 (six) hours as needed for nausea or vomiting.    . potassium chloride (K-DUR) 10 MEQ tablet Take 10 mEq by mouth daily.    . promethazine (PHENERGAN) 25 MG tablet Take 25 mg by mouth every 6 (six) hours as needed for nausea or vomiting.    . rizatriptan (MAXALT) 10 MG tablet Take 10 mg by mouth as needed for migraine. May repeat in 2 hours if needed    . rosuvastatin (CRESTOR) 40 MG tablet Take 40 mg by mouth daily.    . timolol (BETIMOL) 0.25 % ophthalmic solution 1-2 drops 2 (two) times daily.     No current facility-administered medications on file prior to visit.    Past Surgical History  Procedure Laterality Date  . Shouder surgery    . Ablation on endometriosis    . Mouth surgery      tumor removed.    Allergies  Allergen Reactions  . Ace Inhibitors     swelling    History   Social History  . Marital Status: Divorced    Spouse Name: N/A  .  Number of Children: N/A  . Years of Education: N/A   Occupational History  . Not on file.   Social History Main Topics  . Smoking status: Never Smoker   . Smokeless tobacco: Not on file  . Alcohol Use: No  . Drug Use: No  . Sexual Activity: Yes    Birth Control/ Protection: None   Other Topics Concern  . Not on file   Social History Narrative    No family history on file.  BP 155/99 mmHg  Pulse 86  Ht 5\' 5"  (1.651 m)  Wt 195 lb (88.451 kg)  BMI 32.45 kg/m2  Review of Systems: See HPI above.    Objective:  Physical Exam:  Gen: NAD  Right hand: Minimal swelling dorsal hand.  No bruising, other deformity. No malrotation, angulation of digits. TTP mainly 4th, 5th metacarpals and proximal phalanges.  No snuffbox, other wrist/hand tenderness. Able to flex and extend with 5/5 strength  at 4th and 5th MCPs, PIPs, DIPs. Collateral ligaments intact as well. NVI distally.    Assessment & Plan:  1. Right hand injury - Radiographs and MSK u/s were reassuring.  MRI with tenosynovitis 4th and 3rd digit extensor tendons, small intrasubstance externsor tendon tear.  We discussed occupational therapy is about the only thing we can offer her at this point and it would be difficult given her pain level for her to tolerate this.  She has an underlying neuropathy as well and takes gabapentin.  Wrist brace, voltaren with tramadol as needed.  Again encouraged to go through [facility] to see if she could see one of their hand surgeons.

## 2014-04-03 NOTE — Assessment & Plan Note (Signed)
Radiographs and MSK u/s were reassuring.  MRI with tenosynovitis 4th and 3rd digit extensor tendons, small intrasubstance externsor tendon tear.  We discussed occupational therapy is about the only thing we can offer her at this point and it would be difficult given her pain level for her to tolerate this.  She has an underlying neuropathy as well and takes gabapentin.  Wrist brace, voltaren with tramadol as needed.  Again encouraged to go through [facility] to see if she could see one of their hand surgeons.

## 2014-04-28 ENCOUNTER — Ambulatory Visit: Payer: Self-pay

## 2014-05-01 ENCOUNTER — Telehealth: Payer: Self-pay | Admitting: Family Medicine

## 2014-05-01 NOTE — Telephone Encounter (Signed)
Ok to go ahead with referral.  Thanks! 

## 2014-05-02 ENCOUNTER — Other Ambulatory Visit: Payer: Self-pay | Admitting: *Deleted

## 2014-05-02 DIAGNOSIS — M79641 Pain in right hand: Secondary | ICD-10-CM

## 2014-05-02 NOTE — Telephone Encounter (Signed)
Spoke to patient and told her that I would put in the referral for physical therapy. Referral placed.

## 2014-05-03 ENCOUNTER — Ambulatory Visit (INDEPENDENT_AMBULATORY_CARE_PROVIDER_SITE_OTHER): Payer: Self-pay | Admitting: Family Medicine

## 2014-05-03 ENCOUNTER — Encounter: Payer: Self-pay | Admitting: Family Medicine

## 2014-05-03 VITALS — BP 140/87 | HR 83 | Ht 65.0 in | Wt 203.0 lb

## 2014-05-03 DIAGNOSIS — M25511 Pain in right shoulder: Secondary | ICD-10-CM

## 2014-05-03 DIAGNOSIS — M79604 Pain in right leg: Secondary | ICD-10-CM

## 2014-05-03 MED ORDER — METHYLPREDNISOLONE ACETATE 40 MG/ML IJ SUSP
40.0000 mg | Freq: Once | INTRAMUSCULAR | Status: AC
  Administered 2014-05-03: 40 mg via INTRA_ARTICULAR

## 2014-05-03 NOTE — Patient Instructions (Signed)
You have rotator cuff impingement of the right shoulder. Try to avoid painful activities (overhead activities, lifting with extended arm) as much as possible. Aleve 2 tabs twice a day with food OR ibuprofen 3 tabs three times a day with food for pain and inflammation. Continue your neurontin. Subacromial injection may be beneficial to help with pain and to decrease inflammation - you were given this today. Do arm circles, pendulums, and table slides once or twice a day as we discussed. If not improving at follow-up we will consider further imaging.  Only thing I would consider that could find a cause of your right leg pain and weakness is an MRI of your low back (lumbar spine). When medicaid goes through if you want to do this give [place] a call.

## 2014-05-04 DIAGNOSIS — M79604 Pain in right leg: Secondary | ICD-10-CM | POA: Insufficient documentation

## 2014-05-04 DIAGNOSIS — M25511 Pain in right shoulder: Secondary | ICD-10-CM | POA: Insufficient documentation

## 2014-05-04 NOTE — Assessment & Plan Note (Signed)
I doubt an orthopedic cause of her symptoms of pain, weakness, numbness in the right leg.  Discussed only possibility would be referred pain from back, lumbar radiculopathy.  This would be expected to show up on NCV/EMGs that she has had though.  Discussed MRI of lumbar spine is only consideration from our perspective to consider when medicaid goes through.

## 2014-05-04 NOTE — Progress Notes (Signed)
PCP: No primary care provider on file.  Subjective:   HPI: Patient is a 54 y.o. female here for right shoulder pain, right leg pain.  Patient has had trouble with right knee, lower leg for several years. Reports difficulty lifting right leg, weakness. Seen regularly by neurology including EMGs which about 5-6 months ago were negative - just seen there over a week ago. Script written for an AFO but she has not gotten this yet - does not currently have insurance though Medicaid is going through again. Right shoulder pain she believes may be for the same amount of time. Pain with reaching, trying to use arm overhead. No numbness in this arm. + night pain. Is on gabapentin.  Past Medical History  Diagnosis Date  . Diabetes mellitus without complication   . Hypertension   . GERD (gastroesophageal reflux disease)   . Neuropathy     Current Outpatient Prescriptions on File Prior to Visit  Medication Sig Dispense Refill  . diclofenac (VOLTAREN) 75 MG EC tablet Take 1 tablet (75 mg total) by mouth 2 (two) times daily. 60 tablet 1  . esomeprazole (NEXIUM) 40 MG capsule Take 40 mg by mouth daily at 12 noon.    Marland Kitchen HYDROCHLOROTHIAZIDE PO Take 25 mg by mouth.     . loratadine (CLARITIN) 10 MG tablet Take 10 mg by mouth daily.    . metaxalone (SKELAXIN) 800 MG tablet Take 800 mg by mouth every morning.    . metFORMIN (GLUCOPHAGE) 500 MG tablet Take by mouth 2 (two) times daily with a meal.    . ondansetron (ZOFRAN) 4 MG tablet Take 4 mg by mouth every 6 (six) hours as needed for nausea or vomiting.    . potassium chloride (K-DUR) 10 MEQ tablet Take 10 mEq by mouth daily.    . promethazine (PHENERGAN) 25 MG tablet Take 25 mg by mouth every 6 (six) hours as needed for nausea or vomiting.    . rizatriptan (MAXALT) 10 MG tablet Take 10 mg by mouth as needed for migraine. May repeat in 2 hours if needed    . rosuvastatin (CRESTOR) 40 MG tablet Take 40 mg by mouth daily.    . timolol (BETIMOL) 0.25 %  ophthalmic solution 1-2 drops 2 (two) times daily.    . traMADol (ULTRAM) 50 MG tablet Take 1 tablet (50 mg total) by mouth every 6 (six) hours as needed. 60 tablet 0   No current facility-administered medications on file prior to visit.    Past Surgical History  Procedure Laterality Date  . Shouder surgery    . Ablation on endometriosis    . Mouth surgery      tumor removed.    Allergies  Allergen Reactions  . Celecoxib Anxiety  . Ace Inhibitors     swelling    History   Social History  . Marital Status: Divorced    Spouse Name: N/A  . Number of Children: N/A  . Years of Education: N/A   Occupational History  . Not on file.   Social History Main Topics  . Smoking status: Never Smoker   . Smokeless tobacco: Not on file  . Alcohol Use: No  . Drug Use: No  . Sexual Activity: Yes    Birth Control/ Protection: None   Other Topics Concern  . Not on file   Social History Narrative    No family history on file.  BP 140/87 mmHg  Pulse 83  Ht 5\' 5"  (1.651 m)  Wt  203 lb (92.08 kg)  BMI 33.78 kg/m2  Review of Systems: See HPI above.    Objective:  Physical Exam:  Gen: NAD  Right shoulder: No swelling, ecchymoses.  No gross deformity. Diffuse TTP. Full ER and IR.  Abduction and flexion only to 80 degrees, painful. Positive Hawkins, Neers. Negative Yergasons. Strength 4/5 with empty can and resisted external rotation. 5/5 internal rotation.  Pain with all motions. NV intact distally.    Right lower leg: No gross deformity, swelling, bruising. TTP diffusely knee, calf, into foot. FROM knee and ankle. Strength 3+/5 with knee extension and flexion, plantar and dorsiflexion.  Assessment & Plan:  1. Right shoulder pain - consistent with rotator cuff impingement.  Start with codman exercises and subacromial injection which was done today.  Continue gabapentin.  NSAIDs as needed.  Consider physical therapy, further imaging in future.  2.  Right leg pain and  weakness - I doubt an orthopedic cause of her symptoms of pain, weakness, numbness in the right leg.  Discussed only possibility would be referred pain from back, lumbar radiculopathy.  This would be expected to show up on NCV/EMGs that she has had though.  Discussed MRI of lumbar spine is only consideration from our perspective to consider when medicaid goes through.

## 2014-05-04 NOTE — Assessment & Plan Note (Signed)
consistent with rotator cuff impingement.  Start with codman exercises and subacromial injection which was done today.  Continue gabapentin.  NSAIDs as needed.  Consider physical therapy, further imaging in future.  After informed written consent, patient was seated on exam table. Right shoulder was prepped with alcohol swab and utilizing posterior approach, patient's right subacromial space was injected with 3:1 marcaine: depomedrol. Patient tolerated the procedure well without immediate complications.

## 2014-05-15 ENCOUNTER — Ambulatory Visit: Payer: Self-pay | Admitting: Rehabilitation

## 2014-05-16 ENCOUNTER — Ambulatory Visit: Payer: Medicaid Other | Attending: Family Medicine | Admitting: Rehabilitation

## 2014-05-16 ENCOUNTER — Telehealth: Payer: Self-pay | Admitting: Family Medicine

## 2014-05-16 DIAGNOSIS — M79641 Pain in right hand: Secondary | ICD-10-CM

## 2014-05-16 NOTE — Telephone Encounter (Signed)
Spoke to patient and told her that I would get with physical therapy about sending patient to occupational therapy.

## 2014-05-17 NOTE — Therapy (Signed)
[facility] [address]  [place], [place], [address] Phone: [phone]   Fax:  [phone]  Physical Therapy Evaluation  Patient Details  Name: [name] MRN: [id number] Date of Birth: 09/11/1960 Referring Provider:  Dene Gentry, MD  Encounter Date: 05/16/2014      PT End of Session - 05/17/14 0952    Visit Number 1   PT Start Time 1400   PT Stop Time 1440   PT Time Calculation (min) 40 min      Past Medical History  Diagnosis Date  . Diabetes mellitus without complication   . Hypertension   . GERD (gastroesophageal reflux disease)   . Neuropathy     Past Surgical History  Procedure Laterality Date  . Shouder surgery    . Ablation on endometriosis    . Mouth surgery      tumor removed.    There were no vitals filed for this visit.  Visit Diagnosis:  Hand pain, right      Subjective Assessment - 05/16/14 1406    Symptoms Pt presents with R hand "torn tendon and severe tendonitis" due to a fall last September/october.  Reports that she loses her balance constantly due to R sided leg weakness and foot drop due to no known reason.  Walks with walking stick now to prevent falls.  Getting an AFO soon. landed on the R side when falling.  Reported immediate R hand pain and MRI here showing extensor tenson tendonitis and small tear of the ring finger extensor mechanism.  Pt reports she now has minimal use of the R UE, as severe constant pain, and is unable to write or do much with that hand. She reports that she is not getting answers as to why she is still in such bad shape after more than 1 year.                                             Plan - 05/17/14 0956    Clinical Impression Statement Will not have PT here at this time due to severity of hand symptoms.  May return if insurance limitations impede her ability to seek treatment   Recommended Other Services  hand specialist   Consulted and Agree with Plan of Care Patient         Problem List Patient Active Problem List   Diagnosis Date Noted  . Right leg pain 05/04/2014  . Right shoulder pain 05/04/2014  . Breast screening 03/10/2014  . Cervical cancer screening 03/10/2014  . Airway hyperreactivity 01/31/2014  . Acid reflux 01/31/2014  . Glaucoma 01/31/2014  . Benign hypertension 01/31/2014  . Diabetes mellitus, type 2 01/31/2014  . Benign neoplasm 01/24/2014  . Injury of right hand 10/05/2013  . Leg weakness 08/16/2013  . Acute onset aura migraine 12/03/2012    Stark Bray, DPT, CMP 05/17/2014, 9:58 AM  [facility] [address]  [address] [place], [place], [address] Phone: [phone]   Fax:  [phone]

## 2014-05-17 NOTE — Patient Instructions (Signed)
Due to severity of R hand involvement patient was advised to follow-up with hand specialist to pursue either PT or OT there.  Was given the information for [employer] and the hand center of [facility].  Will call to follow-up with patient next week to see if any progress was made in pursuing this.  Was given a handout on AROM for the hand as well as the use of a grip ball.

## 2014-05-18 ENCOUNTER — Other Ambulatory Visit: Payer: Self-pay | Admitting: *Deleted

## 2014-05-18 ENCOUNTER — Telehealth: Payer: Self-pay | Admitting: Rehabilitation

## 2014-05-18 DIAGNOSIS — M79641 Pain in right hand: Secondary | ICD-10-CM

## 2014-05-18 NOTE — Telephone Encounter (Signed)
Talked to patient about proceeding with occupational therapy at [address]. Patient agreeable.

## 2014-06-01 ENCOUNTER — Encounter: Payer: Self-pay | Admitting: Occupational Therapy

## 2014-06-01 ENCOUNTER — Ambulatory Visit: Payer: Medicaid Other | Attending: Family Medicine | Admitting: Occupational Therapy

## 2014-06-01 DIAGNOSIS — M79641 Pain in right hand: Secondary | ICD-10-CM | POA: Diagnosis present

## 2014-06-01 DIAGNOSIS — M25641 Stiffness of right hand, not elsewhere classified: Secondary | ICD-10-CM | POA: Diagnosis not present

## 2014-06-01 NOTE — Patient Instructions (Signed)
Flexor Tendon Gliding (Active Hook Fist)   With fingers and knuckles straight, bend middle and tip joints. Do not bend large knuckles. Repeat _10-15___ times. Do _4-6___ sessions per day.  MP Flexion (Active)   With back of hand on table, bend large knuckles as far as they will go, keeping small joints straight. Repeat _10-15___ times. Do __4-6__ sessions per day. Activity: Reach into a narrow container.*      Finger Flexion / Extension   With palm up, bend fingers of left hand toward palm, making a  fist. Straighten fingers, opening fist. Repeat sequence _10-15___ times per session. Do _4-6__ sessions per day. Hand Variation: Palm down   Copyright  VHI. All rights reserved.   1. Grip Strengthening (Resistive Putty)   Squeeze putty using thumb and all fingers. Repeat _20___ times. Do __2__ sessions per day.   2. Roll putty into tube on table and pinch between each finger and thumb x 10 reps each. ( do ring and small finger together!!)  Desensitization Techniques  Perform these exercises ever 2 hours for 15 minute sessions.  Progress to the next exercises when the exercises you are doing become easy.  1)  Using light pressure, rub the various textures along with the hypersensitive area:  A.  [place]  C.  Wool  G.  Cotton material  D.  Terry cloth  2)  With the same textures use a firmer pressure. 3)  Use a hand held vibrator and massage along the sensitive area. 4)  With a small dowel rod, eraser on a pencil or base of an ink pen tap along the sensitive area. 5)  Use an empty roll-on deodorant bottle to roll along the sensitive area. 6)  Place your hand/forearm in separate containers of the following items:  A.  Sand  D.  Dry lentil beans  B.  Dry Rice  E.  Dry kidney beans  C.  Ball bearings F.  Dry pinto beans  Scar Massage Purpose: To soften/smooth scar tissue.   To desensitize sensitive areas after surgery.   To  mechanically break up inner scar tissue, adhesions, therefore allowing freer        movement of injured tendons and muscle.  Technique: Use a cream to massage with, as it insures a smooth gliding motion and avoids irritation    caused by rubbing skin to skin.  Cream is preferred over a lotion.   May begin as soon as any suture areas are healed.   Apply a firm, steady pressure with your finger-tip, pulling the skin in a circular motion over the   scarred area.  Do not rub.  Message 5 minutes, at least 2 times a day, unless you are getting tender afterwards      Copyright  VHI. All rights reserved.

## 2014-06-01 NOTE — Therapy (Signed)
[facility] [address] [place], [place], [address] Phone: [phone]   Fax:  [phone]  Occupational Therapy Evaluation  Patient Details  Name: [name] MRN: [id number] Date of Birth: 29-Nov-1960 Referring Provider:  Dene Gentry, MD  Encounter Date: 06/01/2014      OT End of Session - 06/01/14 1551    Visit Number 1   Authorization Type MEDICAID   Authorization Time Period N/A - no qualifying diagnosis for continued therapy   OT Start Time 1350   OT Stop Time 1450   OT Time Calculation (min) 60 min   Activity Tolerance Patient tolerated treatment well      Past Medical History  Diagnosis Date  . Diabetes mellitus without complication   . Hypertension   . GERD (gastroesophageal reflux disease)   . Neuropathy     Past Surgical History  Procedure Laterality Date  . Shouder surgery    . Ablation on endometriosis    . Mouth surgery      tumor removed.    There were no vitals filed for this visit.  Visit Diagnosis:  Pain in joint involving hand, right - Plan: Ot plan of care cert/re-cert  Stiffness of joint, hand, right - Plan: Ot plan of care cert/re-cert      Subjective Assessment - 06/01/14 1401    Subjective  My hand has hurt since September 2015 from a fall   Repetition Increases Symptoms   Currently in Pain? Yes   Pain Score 9   8-10 constantly   Pain Location Hand   Pain Orientation Right   Pain Descriptors / Indicators Aching;Nagging;Throbbing;Sore;Burning   Pain Type Chronic pain   Pain Onset More than a month ago   Pain Frequency Constant   Aggravating Factors  bumping it, worse at night   Pain Relieving Factors nothing           [facility] OT Assessment - 06/01/14 1404    Assessment   Diagnosis Hand pain, Rt dominant hand.    Onset Date --  September 2015   Assessment Pt reports MRI revealed extensor tendonitis of the hand as well as a small tear of the tendon   Prior Therapy one time evaluation for P.T.    Precautions   Precautions Fall   Required Braces or Orthoses Other Brace/Splint  wears pre-fab wrist brace provided by MD for pain   Balance Screen   Has the patient fallen in the past 6 months Yes   How many times? multiple times   Has the patient had a decrease in activity level because of a fear of falling?  Yes   Is the patient reluctant to leave their home because of a fear of falling?  Yes   Home  Environment   Family/patient expects to be discharged to: Private residence   Available Help at Discharge --  Daughter lives close by and assist prn daily   Type of Home House   Bathroom Shower/Tub Tub/Shower unit;Curtain   Lives With Alone  in 1 story home with 1 STE.    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 Pt can  do light modified cooking and cleaning tasks using Lt hand, but requires daughter to assist with most IADLS.    Written Expression   Dominant Hand Right  BUT using Lt hand as dominant hand since injury in Sept 2015   Handwriting --  decreased legibility due to using Lt hand    Sensation   Additional Comments pt has become hypersensitive to touch. Pt also reports hand feels cold all the time   Coordination   Coordination Pt can pick pen up off table with Rt hand but unable to manipulate in hand or within fingertips   Edema   Edema mild to moderate all fingers Rt hand; moderate at MP joints all digits. moderate to severe at thumb DIP    ROM / Strength   AROM / PROM / Strength AROM   AROM   Overall AROM Comments Rt hand: Pt only demo approx. 40% mass composite flexion with most flexion  occuring at MP's (little flexion at IP's and no IP flexion of small finger). MP's rest in flexion; active extension at MP's digits 2-5: -30*, -45*, -35*, and -45*   Hand Function   Right Hand Grip (lbs) 9 lbs   Left Hand Grip (lbs) 47 lbs  (Pt reports CTS developing in Lt hand)                  OT Treatments/Exercises (OP) - 06/01/14 1545    ADLs   ADL Comments Pt provided with tan and red foam to build up writing and eating utensils for better success and return to writing and feeding self with Rt hand. Therapist also discussed desensitization techniques for Rt hand due to hypersensitivity. Pt also issued buddy strap (for 5th digit to ring finger) for A/ROM ex's to encourage IP flexion of 5th digit (small finger). Also recommended pt get prescription from MD for tub transfer bench to reduce risk of falls with tub/shower transfers. Pt agreed   Exercises   Exercises Hand   Hand Exercises   Other Hand Exercises Pt issued A/ROM ex's and putty ex's for Rt hand including: intrinsic (+), intrinsic (-) and full fist A/ROM; and mass grasp and tip pinch with yellow putty. Therapist demo each exercise and pt verbalized understanding.                OT Education - 06/01/14 1550    Education provided Yes   Education Details HEP (for A/ROM and putty), desensitization techniques   Person(s) Educated Patient   Methods Explanation;Demonstration;Handout   Comprehension Verbalized understanding             OT Long Term Goals - 06/01/14 1556    OT LONG TERM GOAL #1   Title Verbalize understanding with HEP   Status Achieved   OT LONG TERM GOAL #2   Title Verbalize understanding with desensitization techniques and use of A/E for writing and eating   Status Achieved               Plan - 06/01/14 1552    Clinical Impression Statement Pt is a 54 y.o. female with chronic pain of Rt hand after falling in Sept 2015. Pt reports tendonitis of extensor tendons and small tear from  MRI of Rt hand. Pt has since become very stiff in Rt hand/finger joints with hypersensitivity and constant pain.    Pt will benefit from skilled therapeutic intervention in order to improve on the following deficits (Retired) Decreased range of motion;Impaired flexibility;Impaired UE functional use;Pain  Rehab Potential Poor   Clinical Impairments Affecting Rehab Potential chronic, insurance limitations   OT Frequency One time visit   OT Treatment/Interventions Self-care/ADL training;Patient/family education;Therapeutic exercises;DME and/or AE instruction   Plan one time visit - no f/u O.T. secondary to Medicaid visit limit. Pt does not wish to pursue therapy due to financial reasons   Consulted and Agree with Plan of Care Patient        Problem List Patient Active Problem List   Diagnosis Date Noted  . Right leg pain 05/04/2014  . Right shoulder pain 05/04/2014  . Breast screening 03/10/2014  . Cervical cancer screening 03/10/2014  . Airway hyperreactivity 01/31/2014  . Acid reflux 01/31/2014  . Glaucoma 01/31/2014  . Benign hypertension 01/31/2014  . Diabetes mellitus, type 2 01/31/2014  . Benign neoplasm 01/24/2014  . Injury of right hand 10/05/2013  . Leg weakness 08/16/2013  . Acute onset aura migraine 12/03/2012    Carey Bullocks, OTR/L 06/01/2014, 4:04 PM  [facility] [address] [place], [place], [address] Phone: [phone]   Fax:  [phone]

## 2014-06-06 ENCOUNTER — Encounter: Payer: Self-pay | Admitting: Family Medicine

## 2014-06-06 ENCOUNTER — Ambulatory Visit (INDEPENDENT_AMBULATORY_CARE_PROVIDER_SITE_OTHER): Payer: Medicaid Other | Admitting: Family Medicine

## 2014-06-06 VITALS — BP 129/87 | HR 83 | Ht 64.0 in | Wt 205.0 lb

## 2014-06-06 DIAGNOSIS — M25511 Pain in right shoulder: Secondary | ICD-10-CM

## 2014-06-06 MED ORDER — NITROGLYCERIN 0.2 MG/HR TD PT24: MEDICATED_PATCH | TRANSDERMAL | Status: AC

## 2014-06-06 NOTE — Patient Instructions (Signed)
Start the nitro patches - 1/4th of a patch and change daily. We will try to get an MRI approved for your shoulder since you're not improving. Continue home exercises. For a referral for your hand you would need a primary care physician to refer you with medicaid.

## 2014-06-08 NOTE — Progress Notes (Signed)
PCP: Marlou Sa ERIC, MD  Subjective:   HPI: Patient is a 54 y.o. female here for right shoulder pain, right leg pain.  3/16: Patient has had trouble with right knee, lower leg for several years. Reports difficulty lifting right leg, weakness. Seen regularly by neurology including EMGs which about 5-6 months ago were negative - just seen there over a week ago. Script written for an AFO but she has not gotten this yet - does not currently have insurance though Medicaid is going through again. Right shoulder pain she believes may be for the same amount of time. Pain with reaching, trying to use arm overhead. No numbness in this arm. + night pain. Is on gabapentin.  4/19: Patient returned today for right shoulder pain - she reports cortisone shot only helped a little bit. Pain in shoulder back up to 7/10 level. Medicaid has gone through now. Worse overhead lifting, reaching.  Past Medical History  Diagnosis Date  . Diabetes mellitus without complication   . Hypertension   . GERD (gastroesophageal reflux disease)   . Neuropathy     Current Outpatient Prescriptions on File Prior to Visit  Medication Sig Dispense Refill  . diclofenac (VOLTAREN) 75 MG EC tablet Take 1 tablet (75 mg total) by mouth 2 (two) times daily. (Patient not taking: Reported on 06/01/2014) 60 tablet 1  . esomeprazole (NEXIUM) 40 MG capsule Take 40 mg by mouth daily at 12 noon.    . gabapentin (NEURONTIN) 300 MG capsule Take 300 mg by mouth.    Marland Kitchen HYDROCHLOROTHIAZIDE PO Take 25 mg by mouth.     Marland Kitchen ibuprofen (ADVIL,MOTRIN) 800 MG tablet Take 800 mg by mouth every 8 (eight) hours as needed.    . loratadine (CLARITIN) 10 MG tablet Take 10 mg by mouth daily.    . metaxalone (SKELAXIN) 800 MG tablet Take 800 mg by mouth every morning.    . metFORMIN (GLUCOPHAGE) 500 MG tablet Take by mouth 2 (two) times daily with a meal.    . ondansetron (ZOFRAN) 4 MG tablet Take 4 mg by mouth every 6 (six) hours as needed for nausea or  vomiting.    . potassium chloride (K-DUR) 10 MEQ tablet Take 10 mEq by mouth daily.    . promethazine (PHENERGAN) 25 MG tablet Take 25 mg by mouth every 6 (six) hours as needed for nausea or vomiting.    . rizatriptan (MAXALT) 10 MG tablet Take 10 mg by mouth as needed for migraine. May repeat in 2 hours if needed    . rosuvastatin (CRESTOR) 40 MG tablet Take 40 mg by mouth daily.    . timolol (BETIMOL) 0.25 % ophthalmic solution 1-2 drops 2 (two) times daily.    . traMADol (ULTRAM) 50 MG tablet Take 1 tablet (50 mg total) by mouth every 6 (six) hours as needed. 60 tablet 0   No current facility-administered medications on file prior to visit.    Past Surgical History  Procedure Laterality Date  . Shouder surgery    . Ablation on endometriosis    . Mouth surgery      tumor removed.    Allergies  Allergen Reactions  . Celecoxib Anxiety  . Ace Inhibitors     swelling    History   Social History  . Marital Status: Divorced    Spouse Name: N/A  . Number of Children: N/A  . Years of Education: N/A   Occupational History  . Not on file.   Social History Main Topics  .  Smoking status: Never Smoker   . Smokeless tobacco: Not on file  . Alcohol Use: No  . Drug Use: No  . Sexual Activity: Yes    Birth Control/ Protection: None   Other Topics Concern  . Not on file   Social History Narrative    No family history on file.  BP 129/87 mmHg  Pulse 83  Ht 5\' 4"  (1.626 m)  Wt 205 lb (92.987 kg)  BMI 35.17 kg/m2  Review of Systems: See HPI above.    Objective:  Physical Exam:  Gen: NAD  Right shoulder: No swelling, ecchymoses.  No gross deformity. Diffuse TTP. Full ER and IR.  Abduction and flexion only to 80 degrees, painful. Positive Hawkins, Neers. Negative Yergasons. Strength 4/5 with empty can and resisted external rotation. 5/5 internal rotation.  Pain with all motions. NV intact distally.    Right lower leg - not repeated today. No gross deformity,  swelling, bruising. TTP diffusely knee, calf, into foot. FROM knee and ankle. Strength 3+/5 with knee extension and flexion, plantar and dorsiflexion.  Assessment & Plan:  1. Right shoulder pain - consistent with rotator cuff impingement but not improving as expected with HEP and injection to date over 5 weeks.  Will go ahead with MRI to assess for rotator cuff tear.  Start nitro patches in meantime (discussed risks of headaches, skin irritation).  2.  Right leg pain and weakness - As discussed at last visit I doubt an orthopedic cause of her symptoms of pain, weakness, numbness in the right leg.  Discussed only possibility would be referred pain from back, lumbar radiculopathy.  This would be expected to show up on NCV/EMGs that she has had though.  Any knee or hip arthritis, pathology would not explain all her symptoms in this leg.

## 2014-06-08 NOTE — Assessment & Plan Note (Signed)
consistent with rotator cuff impingement but not improving as expected with HEP and injection to date over 5 weeks.  Will go ahead with MRI to assess for rotator cuff tear.  Start nitro patches in meantime (discussed risks of headaches, skin irritation).
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
## 2014-07-05 ENCOUNTER — Encounter: Payer: Self-pay | Admitting: Family Medicine

## 2014-07-05 ENCOUNTER — Ambulatory Visit (HOSPITAL_BASED_OUTPATIENT_CLINIC_OR_DEPARTMENT_OTHER)
Admission: RE | Admit: 2014-07-05 | Discharge: 2014-07-05 | Disposition: A | Discharge status: Home / Self Care | Payer: Medicaid Other | Source: Ambulatory Visit | Attending: Family Medicine | Admitting: Family Medicine

## 2014-07-05 ENCOUNTER — Ambulatory Visit (INDEPENDENT_AMBULATORY_CARE_PROVIDER_SITE_OTHER): Payer: Medicaid Other | Admitting: Family Medicine

## 2014-07-05 VITALS — BP 126/87 | HR 83 | Ht 64.0 in | Wt 207.0 lb

## 2014-07-05 DIAGNOSIS — M79641 Pain in right hand: Secondary | ICD-10-CM | POA: Diagnosis present

## 2014-07-05 DIAGNOSIS — S6991XD Unspecified injury of right wrist, hand and finger(s), subsequent encounter: Secondary | ICD-10-CM | POA: Diagnosis not present

## 2014-07-05 DIAGNOSIS — S6991XA Unspecified injury of right wrist, hand and finger(s), initial encounter: Secondary | ICD-10-CM

## 2014-07-05 DIAGNOSIS — M25511 Pain in right shoulder: Secondary | ICD-10-CM | POA: Diagnosis not present

## 2014-07-05 NOTE — Patient Instructions (Signed)
Your x-rays are negative for a fracture. This is consistent with a sprain of the 5th MCP joint of your hand. Try the ulnar gutter splint for comfort for next 2 weeks. Follow up with me at that time. Aleve 2 tabs twice a day with food OR ibuprofen 600mg  three times a day with food for pain and inflammation. Try to follow up on the hand surgeon referral from your primary care provider - this is the only other option I believe for you at this point.

## 2014-07-06 NOTE — Progress Notes (Signed)
Patient ID: [name], female   DOB: Jan 15, 1961, 54 y.o.   MRN: [id number]  PCP: Kevan Ny, MD  Subjective:   HPI: Patient is a 54 y.o. female here for right hand injury.  8/17: Patient reports 3 weeks ago she fell and hit right hand against cabinet. Unsure what part of hand struck it or if wrist bent a certain direction. + swelling and a lot of pain. Radiographs initially and when repeated were negative for fracture. Unable to use right hand still. Using a wrap but not a brace or splint. Is right handed. No prior issues.  9/14: Patient reports pain has not improved in right hand. Some swelling as well. Pain worse at nighttime. Has been buddy taping 4th and 5th digits together, wrist brace. She has a neurologist as well for neuropathy - has not been on her gabapentin. Getting a burning sensation ulnar side of left wrist as well without injury.  03/29/14: Patient reports she continues to struggle with use of her right hand. Difficulty using this. 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 No changes from prior visit.  5/18: Patient reports on 5/16 she accidentally struck door knob on dorsal ulnar aspect of hand when doing home exercises. + swelling. Difficulty bending fingers since then. Felt like it popped around the 5th MCP joint - did not have to pop this back into place.  Past Medical History  Diagnosis Date  . Diabetes mellitus without complication   . Hypertension   . GERD (gastroesophageal reflux disease)   . Neuropathy     Current Outpatient Prescriptions on File Prior to Visit  Medication Sig Dispense Refill  . busPIRone (BUSPAR) 15 MG tablet   3  . diclofenac (VOLTAREN) 75 MG EC tablet Take 1 tablet (75 mg total) by mouth 2 (two) times daily. (Patient not taking: Reported on 06/01/2014) 60 tablet 1  . esomeprazole (NEXIUM) 40 MG capsule Take 40 mg by mouth daily at 12 noon.    . gabapentin (NEURONTIN) 300 MG capsule  Take 300 mg by mouth.    Marland Kitchen HYDROCHLOROTHIAZIDE PO Take 25 mg by mouth.     Marland Kitchen ibuprofen (ADVIL,MOTRIN) 800 MG tablet Take 800 mg by mouth every 8 (eight) hours as needed.    . loratadine (CLARITIN) 10 MG tablet Take 10 mg by mouth daily.    . metaxalone (SKELAXIN) 800 MG tablet Take 800 mg by mouth every morning.    . metFORMIN (GLUCOPHAGE) 500 MG tablet Take by mouth 2 (two) times daily with a meal.    . NASONEX 50 MCG/ACT nasal spray   3  . nitroGLYCERIN (NITRODUR - DOSED IN MG/24 HR) 0.2 mg/hr patch Apply 1/4th patch to affected shoulder, change daily 30 patch 1  . ondansetron (ZOFRAN) 4 MG tablet Take 4 mg by mouth every 6 (six) hours as needed for nausea or vomiting.    . potassium chloride (K-DUR) 10 MEQ tablet Take 10 mEq by mouth daily.    . promethazine (PHENERGAN) 25 MG tablet Take 25 mg by mouth every 6 (six) hours as needed for nausea or vomiting.    . rizatriptan (MAXALT) 10 MG tablet Take 10 mg by mouth as needed for migraine. May repeat in 2 hours if needed    . rosuvastatin (CRESTOR) 40 MG tablet Take 40 mg by mouth daily.    . timolol (BETIMOL) 0.25 % ophthalmic solution 1-2 drops 2 (two) times daily.    . traMADol (  ULTRAM) 50 MG tablet Take 1 tablet (50 mg total) by mouth every 6 (six) hours as needed. 60 tablet 0   No current facility-administered medications on file prior to visit.    Past Surgical History  Procedure Laterality Date  . Shouder surgery    . Ablation on endometriosis    . Mouth surgery      tumor removed.    Allergies  Allergen Reactions  . Celecoxib Anxiety  . Ace Inhibitors     swelling    History   Social History  . Marital Status: Divorced    Spouse Name: N/A  . Number of Children: N/A  . Years of Education: N/A   Occupational History  . Not on file.   Social History Main Topics  . Smoking status: Never Smoker   . Smokeless tobacco: Not on file  . Alcohol Use: No  . Drug Use: No  . Sexual Activity: Yes    Birth Control/  Protection: None   Other Topics Concern  . Not on file   Social History Narrative    No family history on file.  BP 126/87 mmHg  Pulse 83  Ht 5\' 4"  (1.626 m)  Wt 207 lb (93.895 kg)  BMI 35.51 kg/m2  Review of Systems: See HPI above.    Objective:  Physical Exam:  Gen: NAD  Right hand: Mild swelling ulnar dorsal hand including 5th MCP.  No bruising, other deformity.  No erythema. No malrotation, angulation of digits. TTP throughout dorsal hand mostly ulnar side including 5th MCP.  No snuffbox, other wrist/hand tenderness. Able to flex and extend with 5/5 strength but pain MCPs, PIPs, DIPs all motion especially 5th digit but also 4th, 3rd digits. Collateral ligaments intact as well. NVI distally.    Assessment & Plan:  1. Right hand injury - Repeat radiographs negative for new fracture.  Consistent with sprain of 5th MCP joint.  Do not think she subluxed or dislocated this joint.  She previously had MRI with tenosynovitis 4th and 3rd digit extensor tendons, small intrasubstance externsor tendon tear.  Will switch from her wrist brace to ulnar gutter splint for comfort.  She has an underlying neuropathy as well and takes gabapentin.  NSAIDs as needed.  Has referral in by PCP to see hand surgery but hasn't heard about this - advised to follow up on this.  F/u in about 2 weeks.

## 2014-07-06 NOTE — Assessment & Plan Note (Signed)
Repeat radiographs negative for new fracture.  Consistent with sprain of 5th MCP joint.  Do not think she subluxed or dislocated this joint.  She previously had MRI with tenosynovitis 4th and 3rd digit extensor tendons, small intrasubstance externsor tendon tear.  Will switch from her wrist brace to ulnar gutter splint for comfort.  She has an underlying neuropathy as well and takes gabapentin.  NSAIDs as needed.  Has referral in by PCP to see hand surgery but hasn't heard about this - advised to follow up on this.  F/u in about 2 weeks.

## 2014-07-07 ENCOUNTER — Ambulatory Visit (HOSPITAL_BASED_OUTPATIENT_CLINIC_OR_DEPARTMENT_OTHER)
Admission: RE | Admit: 2014-07-07 | Discharge: 2014-07-07 | Disposition: A | Discharge status: Home / Self Care | Payer: Medicaid Other | Source: Ambulatory Visit | Attending: Family Medicine | Admitting: Family Medicine

## 2014-07-07 DIAGNOSIS — G8929 Other chronic pain: Secondary | ICD-10-CM | POA: Diagnosis not present

## 2014-07-07 DIAGNOSIS — M7531 Calcific tendinitis of right shoulder: Secondary | ICD-10-CM | POA: Insufficient documentation

## 2014-07-07 DIAGNOSIS — M25511 Pain in right shoulder: Secondary | ICD-10-CM | POA: Diagnosis present

## 2014-07-14 ENCOUNTER — Other Ambulatory Visit: Payer: Self-pay | Admitting: *Deleted

## 2014-07-14 MED ORDER — DIAZEPAM 5 MG PO TABS: ORAL_TABLET | ORAL | Status: AC

## 2014-07-19 ENCOUNTER — Ambulatory Visit: Payer: No Typology Code available for payment source | Admitting: Family Medicine

## 2014-07-22 ENCOUNTER — Ambulatory Visit (HOSPITAL_BASED_OUTPATIENT_CLINIC_OR_DEPARTMENT_OTHER)
Admission: RE | Admit: 2014-07-22 | Discharge: 2014-07-22 | Disposition: A | Discharge status: Home / Self Care | Payer: Medicaid Other | Source: Ambulatory Visit | Attending: Family Medicine | Admitting: Family Medicine

## 2014-07-22 DIAGNOSIS — M25511 Pain in right shoulder: Secondary | ICD-10-CM | POA: Insufficient documentation

## 2014-07-22 DIAGNOSIS — M779 Enthesopathy, unspecified: Secondary | ICD-10-CM | POA: Insufficient documentation

## 2014-07-24 ENCOUNTER — Ambulatory Visit (INDEPENDENT_AMBULATORY_CARE_PROVIDER_SITE_OTHER): Payer: Medicaid Other | Admitting: Family Medicine

## 2014-07-24 DIAGNOSIS — M25511 Pain in right shoulder: Secondary | ICD-10-CM | POA: Diagnosis not present

## 2014-07-24 DIAGNOSIS — K121 Other forms of stomatitis: Secondary | ICD-10-CM | POA: Insufficient documentation

## 2014-07-24 DIAGNOSIS — S6991XD Unspecified injury of right wrist, hand and finger(s), subsequent encounter: Secondary | ICD-10-CM

## 2014-07-24 DIAGNOSIS — M79609 Pain in unspecified limb: Secondary | ICD-10-CM | POA: Diagnosis not present

## 2014-07-24 DIAGNOSIS — S6980XA Other specified injuries of unspecified wrist, hand and finger(s), initial encounter: Secondary | ICD-10-CM | POA: Diagnosis not present

## 2014-07-24 NOTE — Patient Instructions (Signed)
You will need to see an orthopedic surgeon for the severe tendinitis with partial tearing if your rotator cuff as you've not responded to the exercises, injection, and nitro patches.

## 2014-07-25 NOTE — Assessment & Plan Note (Signed)
Repeat radiographs negative for new fracture.  Consistent with sprain of 5th MCP joint.  Do not think she subluxed or dislocated this joint.  She previously had MRI with tenosynovitis 4th and 3rd digit extensor tendons, small intrasubstance externsor tendon tear.  Will continue with brace and ulnar gutter splint (either for comfort) while awaiting appointment with hand surgery.   She has an underlying neuropathy as well and takes gabapentin.  NSAIDs as needed.

## 2014-07-25 NOTE — Assessment & Plan Note (Signed)
s/p injection, home exercises, using nitro patches without improvement.  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  Discussed dedicated physical therapy to this vs ortho referral - she would like to go ahead with orthopedic referral.

## 2014-07-25 NOTE — Progress Notes (Signed)
Patient ID: [name], female   DOB: 1960/04/30, 54 y.o.   MRN: [id number]  PCP: [place]  Subjective:   HPI: Patient is a 54 y.o. female here for right hand injury.  8/17: Patient reports 3 weeks ago she fell and hit right hand against cabinet. Unsure what part of hand struck it or if wrist bent a certain direction. + swelling and a lot of pain. Radiographs initially and when repeated were negative for fracture. Unable to use right hand still. Using a wrap but not a brace or splint. Is right handed. No prior issues.  9/14: Patient reports pain has not improved in right hand. Some swelling as well. Pain worse at nighttime. Has been buddy taping 4th and 5th digits together, wrist brace. She has a neurologist as well for neuropathy - has not been on her gabapentin. Getting a burning sensation ulnar side of left wrist as well without injury.  03/29/14: Patient reports she continues to struggle with use of her right hand. Difficulty using this. 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 No changes from prior visit.  5/18: Patient reports on 5/16 she accidentally struck door knob on dorsal ulnar aspect of hand when doing home exercises. + swelling. Difficulty bending fingers since then. Felt like it popped around the 5th MCP joint - did not have to pop this back into place.  6/6: Patient is being referred to hand surgery - still with 10/10 level of pain right hand mostly ulnar area. Using gutter splint mainly at night. Right shoulder still very painful too. Doing home exercises that she can, using nitro patches. S/p injection.  Past Medical History  Diagnosis Date  . Diabetes mellitus without complication   . Hypertension   . GERD (gastroesophageal reflux disease)   . Neuropathy     Current Outpatient Prescriptions on File Prior to Visit  Medication Sig Dispense Refill  . busPIRone (BUSPAR) 15 MG  tablet   3  . diazepam (VALIUM) 5 MG tablet Take 1 tablet by mouth 30 minutes prior to procedure, can repeat x 1 if needed 2 tablet 0  . diclofenac (VOLTAREN) 75 MG EC tablet Take 1 tablet (75 mg total) by mouth 2 (two) times daily. (Patient not taking: Reported on 06/01/2014) 60 tablet 1  . esomeprazole (NEXIUM) 40 MG capsule Take 40 mg by mouth daily at 12 noon.    . gabapentin (NEURONTIN) 300 MG capsule Take 300 mg by mouth.    Marland Kitchen HYDROCHLOROTHIAZIDE PO Take 25 mg by mouth.     Marland Kitchen ibuprofen (ADVIL,MOTRIN) 800 MG tablet Take 800 mg by mouth every 8 (eight) hours as needed.    . loratadine (CLARITIN) 10 MG tablet Take 10 mg by mouth daily.    . metaxalone (SKELAXIN) 800 MG tablet Take 800 mg by mouth every morning.    . metFORMIN (GLUCOPHAGE) 500 MG tablet Take by mouth 2 (two) times daily with a meal.    . NASONEX 50 MCG/ACT nasal spray   3  . nitroGLYCERIN (NITRODUR - DOSED IN MG/24 HR) 0.2 mg/hr patch Apply 1/4th patch to affected shoulder, change daily 30 patch 1  . ondansetron (ZOFRAN) 4 MG tablet Take 4 mg by mouth every 6 (six) hours as needed for nausea or vomiting.    . potassium chloride (K-DUR) 10 MEQ tablet Take 10 mEq by mouth daily.    . promethazine (PHENERGAN) 25 MG tablet Take 25 mg  by mouth every 6 (six) hours as needed for nausea or vomiting.    . rizatriptan (MAXALT) 10 MG tablet Take 10 mg by mouth as needed for migraine. May repeat in 2 hours if needed    . rosuvastatin (CRESTOR) 40 MG tablet Take 40 mg by mouth daily.    . timolol (BETIMOL) 0.25 % ophthalmic solution 1-2 drops 2 (two) times daily.    . traMADol (ULTRAM) 50 MG tablet Take 1 tablet (50 mg total) by mouth every 6 (six) hours as needed. 60 tablet 0   No current facility-administered medications on file prior to visit.    Past Surgical History  Procedure Laterality Date  . Shouder surgery    . Ablation on endometriosis    . Mouth surgery      tumor removed.    Allergies  Allergen Reactions  .  Celecoxib Anxiety  . Ace Inhibitors     swelling    History   Social History  . Marital Status: Divorced    Spouse Name: N/A  . Number of Children: N/A  . Years of Education: N/A   Occupational History  . Not on file.   Social History Main Topics  . Smoking status: Never Smoker   . Smokeless tobacco: Not on file  . Alcohol Use: No  . Drug Use: No  . Sexual Activity: Yes    Birth Control/ Protection: None   Other Topics Concern  . Not on file   Social History Narrative    No family history on file.  There were no vitals taken for this visit.  Review of Systems: See HPI above.    Objective:  Physical Exam:  Gen: NAD  Right hand: Mild swelling ulnar dorsal hand including 5th MCP.  No bruising, other deformity.  No erythema. No malrotation, angulation of digits. TTP throughout dorsal hand mostly ulnar side including 5th MCP.  No snuffbox, other wrist/hand tenderness. Able to flex and extend with 5/5 strength but pain MCPs, PIPs, DIPs all motion especially 5th digit but also 4th, 3rd digits. Collateral ligaments intact as well. NVI distally.    Assessment & Plan:  1. Right hand injury - Repeat radiographs negative for new fracture.  Consistent with sprain of 5th MCP joint.  Do not think she subluxed or dislocated this joint.  She previously had MRI with tenosynovitis 4th and 3rd digit extensor tendons, small intrasubstance externsor tendon tear.  Will continue with brace and ulnar gutter splint (either for comfort) while awaiting appointment with hand surgery.   She has an underlying neuropathy as well and takes gabapentin.  NSAIDs as needed.    2. Right shoulder pain - s/p injection, home exercises, using nitro patches without improvement.  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  Discussed dedicated physical therapy to this vs ortho referral - she would like to go ahead with orthopedic referral.

## 2014-08-15 ENCOUNTER — Telehealth: Payer: Self-pay | Admitting: Family Medicine

## 2014-08-15 NOTE — Telephone Encounter (Signed)
We have not evaluated her for low back pain - she's dealing with issues of her hand and shoulder for which she was referred to orthopedics.  MRI is not the first step in evaluating someone's low back pain.

## 2014-08-15 NOTE — Telephone Encounter (Signed)
Spoke to patient and told her that we have not seen her for her back and she would have to make an appointment. Stated she would contact front office to make appointment then would discuss next steps depending on exam.

## 2014-08-16 ENCOUNTER — Ambulatory Visit (INDEPENDENT_AMBULATORY_CARE_PROVIDER_SITE_OTHER): Payer: Medicaid Other | Admitting: Family Medicine

## 2014-08-16 ENCOUNTER — Encounter: Payer: Self-pay | Admitting: Family Medicine

## 2014-08-16 VITALS — BP 133/89 | HR 78 | Ht 65.0 in | Wt 214.0 lb

## 2014-08-16 DIAGNOSIS — M5441 Lumbago with sciatica, right side: Secondary | ICD-10-CM | POA: Diagnosis present

## 2014-08-17 DIAGNOSIS — M545 Low back pain, unspecified: Secondary | ICD-10-CM | POA: Insufficient documentation

## 2014-08-17 NOTE — Assessment & Plan Note (Signed)
She has known right sided foot drop, leg pain, weakness along with her low back pain.  She is being referred to a different neurology group for a second opinion and I think this is the appropriate next step in her treatment course.  Would like to identify one systemic cause for all of her symptoms and neurologic cause is most likely.  Could still consider MRI of lumbar spine in future.

## 2014-08-17 NOTE — Progress Notes (Signed)
PCP: [facility]  Subjective:   HPI: Patient is a 54 y.o. female here for low back, right leg pain  3/16: Patient has had trouble with right knee, lower leg for several years. Reports difficulty lifting right leg, weakness. Seen regularly by neurology including EMGs which about 5-6 months ago were negative - just seen there over a week ago. Script written for an AFO but she has not gotten this yet - does not currently have insurance though Medicaid is going through again. Right shoulder pain she believes may be for the same amount of time. Pain with reaching, trying to use arm overhead. No numbness in this arm. + night pain. Is on gabapentin.  6/29: Patient reports she's had pain in low back for past 2 years. Difficulty lying down, staying in a position for extended time. Has burning, tingling sensation. Radiates into right lower extremity. Using an AFO on this leg for foot drop. Neurology hasn't been able to identify a cause of her symptoms - PCP is referring her for a second opinion.  Past Medical History  Diagnosis Date  . Diabetes mellitus without complication   . Hypertension   . GERD (gastroesophageal reflux disease)   . Neuropathy     Current Outpatient Prescriptions on File Prior to Visit  Medication Sig Dispense Refill  . busPIRone (BUSPAR) 15 MG tablet   3  . diazepam (VALIUM) 5 MG tablet Take 1 tablet by mouth 30 minutes prior to procedure, can repeat x 1 if needed 2 tablet 0  . diclofenac (VOLTAREN) 75 MG EC tablet Take 1 tablet (75 mg total) by mouth 2 (two) times daily. (Patient not taking: Reported on 06/01/2014) 60 tablet 1  . esomeprazole (NEXIUM) 40 MG capsule Take 40 mg by mouth daily at 12 noon.    . gabapentin (NEURONTIN) 300 MG capsule Take 300 mg by mouth.    Marland Kitchen HYDROCHLOROTHIAZIDE PO Take 25 mg by mouth.     Marland Kitchen ibuprofen (ADVIL,MOTRIN) 800 MG tablet Take 800 mg by mouth every 8 (eight) hours as needed.    . loratadine  (CLARITIN) 10 MG tablet Take 10 mg by mouth daily.    . metaxalone (SKELAXIN) 800 MG tablet Take 800 mg by mouth every morning.    . metFORMIN (GLUCOPHAGE) 500 MG tablet Take by mouth 2 (two) times daily with a meal.    . NASONEX 50 MCG/ACT nasal spray   3  . nitroGLYCERIN (NITRODUR - DOSED IN MG/24 HR) 0.2 mg/hr patch Apply 1/4th patch to affected shoulder, change daily 30 patch 1  . ondansetron (ZOFRAN) 4 MG tablet Take 4 mg by mouth every 6 (six) hours as needed for nausea or vomiting.    . potassium chloride (K-DUR) 10 MEQ tablet Take 10 mEq by mouth daily.    . promethazine (PHENERGAN) 25 MG tablet Take 25 mg by mouth every 6 (six) hours as needed for nausea or vomiting.    . rizatriptan (MAXALT) 10 MG tablet Take 10 mg by mouth as needed for migraine. May repeat in 2 hours if needed    . rosuvastatin (CRESTOR) 40 MG tablet Take 40 mg by mouth daily.    . timolol (BETIMOL) 0.25 % ophthalmic solution 1-2 drops 2 (two) times daily.    . traMADol (ULTRAM) 50 MG tablet Take 1 tablet (50 mg total) by mouth every 6 (six) hours as needed. 60 tablet 0   No current facility-administered medications on file prior to visit.  Past Surgical History  Procedure Laterality Date  . Shouder surgery    . Ablation on endometriosis    . Mouth surgery      tumor removed.    Allergies  Allergen Reactions  . Celecoxib Anxiety  . Ace Inhibitors     swelling    History   Social History  . Marital Status: Divorced    Spouse Name: N/A  . Number of Children: N/A  . Years of Education: N/A   Occupational History  . Not on file.   Social History Main Topics  . Smoking status: Never Smoker   . Smokeless tobacco: Not on file  . Alcohol Use: No  . Drug Use: No  . Sexual Activity: Yes    Birth Control/ Protection: None   Other Topics Concern  . Not on file   Social History Narrative    No family history on file.  BP 133/89 mmHg  Pulse 78  Ht 5\' 5"  (1.651 m)  Wt 214 lb (97.07 kg)   BMI 35.61 kg/m2  Review of Systems: See HPI above.    Objective:  Physical Exam:  Gen: NAD  Back: No gross deformity, scoliosis. TTP R > L paraspinal lumbar region.  No midline or bony TTP. FROM. Strength 3+/5 with knee extension and flexion, plantar and dorsiflexion.  Strength LEs 5/5 all muscle groups.   2+ MSRs in patellar and achilles tendons, equal bilaterally. Negative SLRs. Negative logroll bilateral hips  Assessment & Plan:  1.  Low back pain - She has known right sided foot drop, leg pain, weakness along with her low back pain.  She is being referred to a different neurology group for a second opinion and I think this is the appropriate next step in her treatment course.  Would like to identify one systemic cause for all of her symptoms and neurologic cause is most likely.  Could still consider MRI of lumbar spine in future.

## 2014-11-07 ENCOUNTER — Emergency Department (HOSPITAL_BASED_OUTPATIENT_CLINIC_OR_DEPARTMENT_OTHER): Payer: Medicaid Other

## 2014-11-07 ENCOUNTER — Encounter (HOSPITAL_BASED_OUTPATIENT_CLINIC_OR_DEPARTMENT_OTHER): Payer: Self-pay

## 2014-11-07 ENCOUNTER — Emergency Department (HOSPITAL_BASED_OUTPATIENT_CLINIC_OR_DEPARTMENT_OTHER)
Admission: EM | Admit: 2014-11-07 | Discharge: 2014-11-07 | Disposition: A | Discharge status: Home / Self Care | Payer: Medicaid Other | Attending: Emergency Medicine | Admitting: Emergency Medicine

## 2014-11-07 DIAGNOSIS — R531 Weakness: Secondary | ICD-10-CM | POA: Diagnosis not present

## 2014-11-07 DIAGNOSIS — G629 Polyneuropathy, unspecified: Secondary | ICD-10-CM | POA: Insufficient documentation

## 2014-11-07 DIAGNOSIS — K219 Gastro-esophageal reflux disease without esophagitis: Secondary | ICD-10-CM | POA: Diagnosis not present

## 2014-11-07 DIAGNOSIS — E119 Type 2 diabetes mellitus without complications: Secondary | ICD-10-CM | POA: Insufficient documentation

## 2014-11-07 DIAGNOSIS — Z79899 Other long term (current) drug therapy: Secondary | ICD-10-CM | POA: Insufficient documentation

## 2014-11-07 DIAGNOSIS — R0789 Other chest pain: Secondary | ICD-10-CM | POA: Insufficient documentation

## 2014-11-07 DIAGNOSIS — I10 Essential (primary) hypertension: Secondary | ICD-10-CM | POA: Insufficient documentation

## 2014-11-07 DIAGNOSIS — R079 Chest pain, unspecified: Secondary | ICD-10-CM

## 2014-11-07 LAB — BASIC METABOLIC PANEL
Anion gap: 11 (ref 5–15)
BUN: 18 mg/dL (ref 6–20)
CALCIUM: 9.1 mg/dL (ref 8.9–10.3)
CO2: 24 mmol/L (ref 22–32)
CREATININE: 1.04 mg/dL — AB (ref 0.44–1.00)
Chloride: 106 mmol/L (ref 101–111)
GFR, EST NON AFRICAN AMERICAN: 59 mL/min — AB (ref >60)
Glucose, Bld: 84 mg/dL (ref 65–99)
Potassium: 3.1 mmol/L — ABNORMAL LOW (ref 3.5–5.1)
SODIUM: 141 mmol/L (ref 135–145)

## 2014-11-07 LAB — CBC WITH DIFFERENTIAL/PLATELET
BASOS ABS: 0 10*3/uL (ref 0.0–0.1)
BASOS PCT: 1 %
EOS ABS: 0.1 10*3/uL (ref 0.0–0.7)
Eosinophils Relative: 1 %
HCT: 38.3 % (ref 36.0–46.0)
HEMOGLOBIN: 12.5 g/dL (ref 12.0–15.0)
Lymphocytes Relative: 50 %
Lymphs Abs: 2.2 10*3/uL (ref 0.7–4.0)
MCH: 30.3 pg (ref 26.0–34.0)
MCHC: 32.6 g/dL (ref 30.0–36.0)
MCV: 93 fL (ref 78.0–100.0)
MONOS PCT: 8 %
Monocytes Absolute: 0.4 10*3/uL (ref 0.1–1.0)
NEUTROS PCT: 40 %
Neutro Abs: 1.8 10*3/uL (ref 1.7–7.7)
Platelets: 175 10*3/uL (ref 150–400)
RBC: 4.12 MIL/uL (ref 3.87–5.11)
RDW: 12.5 % (ref 11.5–15.5)
WBC: 4.4 10*3/uL (ref 4.0–10.5)

## 2014-11-07 LAB — D-DIMER, QUANTITATIVE (NOT AT ARMC)

## 2014-11-07 LAB — TROPONIN I

## 2014-11-07 MED ORDER — PENTAFLUOROPROP-TETRAFLUOROETH EX AERO
INHALATION_SPRAY | CUTANEOUS | Status: AC
  Filled 2014-11-07: qty 30

## 2014-11-07 NOTE — ED Provider Notes (Signed)
CSN: [id number]     Arrival date & time 11/07/14  1336 History   First MD Initiated Contact with Patient 11/07/14 1359     Chief Complaint  Patient presents with  . Chest Pain     (Consider location/radiation/quality/duration/timing/severity/associated sxs/prior Treatment) Patient is a 54 y.o. female presenting with chest pain. The history is provided by the patient.  Chest Pain Associated symptoms: weakness   Associated symptoms: no back pain, no headache, no nausea, no numbness, no shortness of breath and not vomiting    patient has had pain in her left chest for the last 4 days. Comes and goes. States it was on Saturday that has started contant but now only comes and goes. States it is not with exertion. States she could not make the pain come on. Does not feel like her heart is racing or going slow. Does have a history of palpitations. Does not smoke. She does have some chronic weakness on the right side that is of an undetermined origin. She has seen neurology and sports medicine without clear cause. She wears a brace on her right wrist and right foot. She states she is pain-free now. Pain may have been worse with breathing. No hemoptysis. No swelling or legs.  Past Medical History  Diagnosis Date  . Diabetes mellitus without complication   . Hypertension   . GERD (gastroesophageal reflux disease)   . Neuropathy    Past Surgical History  Procedure Laterality Date  . Shouder surgery    . Ablation on endometriosis    . Mouth surgery      tumor removed.  . Shoulder surgery     No family history on file. Social History  Substance Use Topics  . Smoking status: Never Smoker   . Smokeless tobacco: None  . Alcohol Use: No   OB History    No data available     Review of Systems  Constitutional: Negative for activity change and appetite change.  Eyes: Negative for pain.  Respiratory: Negative for chest tightness and shortness of breath.   Cardiovascular: Positive for chest  pain. Negative for leg swelling.  Gastrointestinal: Negative for nausea, vomiting and diarrhea.  Genitourinary: Negative for flank pain.  Musculoskeletal: Negative for back pain and neck stiffness.  Skin: Negative for rash.  Neurological: Positive for weakness. Negative for numbness and headaches.  Psychiatric/Behavioral: Negative for behavioral problems.      Allergies  Celecoxib and Ace inhibitors  Home Medications   Prior to Admission medications   Medication Sig Start Date End Date Taking? Authorizing Provider  busPIRone (BUSPAR) 15 MG tablet  05/04/14   Historical Provider, MD  diazepam (VALIUM) 5 MG tablet Take 1 tablet by mouth 30 minutes prior to procedure, can repeat x 1 if needed 07/14/14   Dene Gentry, MD  diclofenac (VOLTAREN) 75 MG EC tablet Take 1 tablet (75 mg total) by mouth 2 (two) times daily. Patient not taking: Reported on 06/01/2014 03/29/14   Dene Gentry, MD  esomeprazole (NEXIUM) 40 MG capsule Take 40 mg by mouth daily at 12 noon.    Historical Provider, MD  gabapentin (NEURONTIN) 300 MG capsule Take 300 mg by mouth. 04/24/14   Historical Provider, MD  HYDROCHLOROTHIAZIDE PO Take 25 mg by mouth.     Historical Provider, MD  ibuprofen (ADVIL,MOTRIN) 800 MG tablet Take 800 mg by mouth every 8 (eight) hours as needed.    Historical Provider, MD  loratadine (CLARITIN) 10 MG tablet Take 10 mg by  mouth daily.    Historical Provider, MD  metaxalone (SKELAXIN) 800 MG tablet Take 800 mg by mouth every morning.    Historical Provider, MD  metFORMIN (GLUCOPHAGE) 500 MG tablet Take by mouth 2 (two) times daily with a meal.    Historical Provider, MD  NASONEX 50 MCG/ACT nasal spray  05/08/14   Historical Provider, MD  nitroGLYCERIN (NITRODUR - DOSED IN MG/24 HR) 0.2 mg/hr patch Apply 1/4th patch to affected shoulder, change daily 06/06/14   Dene Gentry, MD  ondansetron (ZOFRAN) 4 MG tablet Take 4 mg by mouth every 6 (six) hours as needed for nausea or vomiting.     Historical Provider, MD  potassium chloride (K-DUR) 10 MEQ tablet Take 10 mEq by mouth daily.    Historical Provider, MD  promethazine (PHENERGAN) 25 MG tablet Take 25 mg by mouth every 6 (six) hours as needed for nausea or vomiting.    Historical Provider, MD  rizatriptan (MAXALT) 10 MG tablet Take 10 mg by mouth as needed for migraine. May repeat in 2 hours if needed    Historical Provider, MD  rosuvastatin (CRESTOR) 40 MG tablet Take 40 mg by mouth daily.    Historical Provider, MD  timolol (BETIMOL) 0.25 % ophthalmic solution 1-2 drops 2 (two) times daily.    Historical Provider, MD  traMADol (ULTRAM) 50 MG tablet Take 1 tablet (50 mg total) by mouth every 6 (six) hours as needed. 03/29/14   Dene Gentry, MD   BP 125/91 mmHg  Pulse 66  Temp(Src) 98 F (36.7 C) (Oral)  Resp 18  Ht 5\' 5"  (1.651 m)  Wt 195 lb (88.451 kg)  BMI 32.45 kg/m2  SpO2 100% Physical Exam  Constitutional: She is oriented to person, place, and time. She appears well-developed and well-nourished.  HENT:  Head: Normocephalic and atraumatic.  Eyes: EOM are normal. Pupils are equal, round, and reactive to light.  Neck: Normal range of motion. Neck supple.  Cardiovascular: Normal rate, regular rhythm and normal heart sounds.   Pulmonary/Chest: Effort normal and breath sounds normal. No respiratory distress. She has no wheezes. She has no rales. She exhibits tenderness.  Tenderness to right anterior chest wall. No crepitance or deformity. No rash.  Abdominal: Soft. Bowel sounds are normal. She exhibits no distension. There is no tenderness. There is no rebound and no guarding.  Musculoskeletal: Normal range of motion.  Neurological: She is alert and oriented to person, place, and time. No cranial nerve deficit.  Skin: Skin is warm and dry.  Psychiatric: She has a normal mood and affect. Her speech is normal.  Nursing note and vitals reviewed.   ED Course  Procedures (including critical care time) Labs  Review Labs Reviewed  BASIC METABOLIC PANEL - Abnormal; Notable for the following:    Potassium 3.1 (*)    Creatinine, Ser 1.04 (*)    GFR calc non Af Amer 59 (*)    All other components within normal limits  CBC WITH DIFFERENTIAL/PLATELET  TROPONIN I  D-DIMER, QUANTITATIVE (NOT AT [facility])    Imaging Review Dg Chest 2 View  11/07/2014   CLINICAL DATA:  Chest pain and shortness of breath for 3 days.  EXAM: CHEST  2 VIEW  COMPARISON:  None available currently.  FINDINGS: The heart size and mediastinal contours are within normal limits. Both lungs are clear. No pneumothorax or pleural effusion is noted. The visualized skeletal structures are unremarkable.  IMPRESSION: No active cardiopulmonary disease.   Electronically Signed  By: Marijo Conception, M.D.   On: 11/07/2014 15:15   I have personally reviewed and evaluated these images and lab results as part of my medical decision-making.   EKG Interpretation None      Date: 11/07/2014  Rate: 79  Rhythm: normal sinus rhythm  QRS Axis: normal  Intervals: normal  ST/T Wave abnormalities: normal  Conduction Disutrbances: none  Narrative Interpretation: unremarkable    MDM   Final diagnoses:  Chest pain, unspecified chest pain type    Patient with chest pain. EKG reassuring enzymes negative. Chest x-ray reassuring. Negative d-dimer. Does not appear to be cardiac cause at this time. Will discharge home    Davonna Belling, MD 11/07/14 [phone]

## 2014-11-07 NOTE — ED Notes (Signed)
Presents with chest pain, onset Saturday. Left ant chest pain. Describes as Stabbing pain.

## 2014-11-07 NOTE — Discharge Instructions (Signed)

## 2014-11-07 NOTE — ED Notes (Signed)
Pt wearing splint to right UE and LE-states she is being evaluated for right side weakness--no dx as of yet

## 2014-11-07 NOTE — ED Notes (Signed)
C/o intermittent CP since Saturday

## 2015-07-24 IMAGING — DX DG HAND COMPLETE 3+V*R*
3 series · 3 of 3 positions shown · non-contrast
Comparison: 10/01/2013

CLINICAL DATA: Hit right hand on door knob in October 2013,
persistent pain

EXAM:
RIGHT HAND - COMPLETE 3+ VIEW

[hand pa]
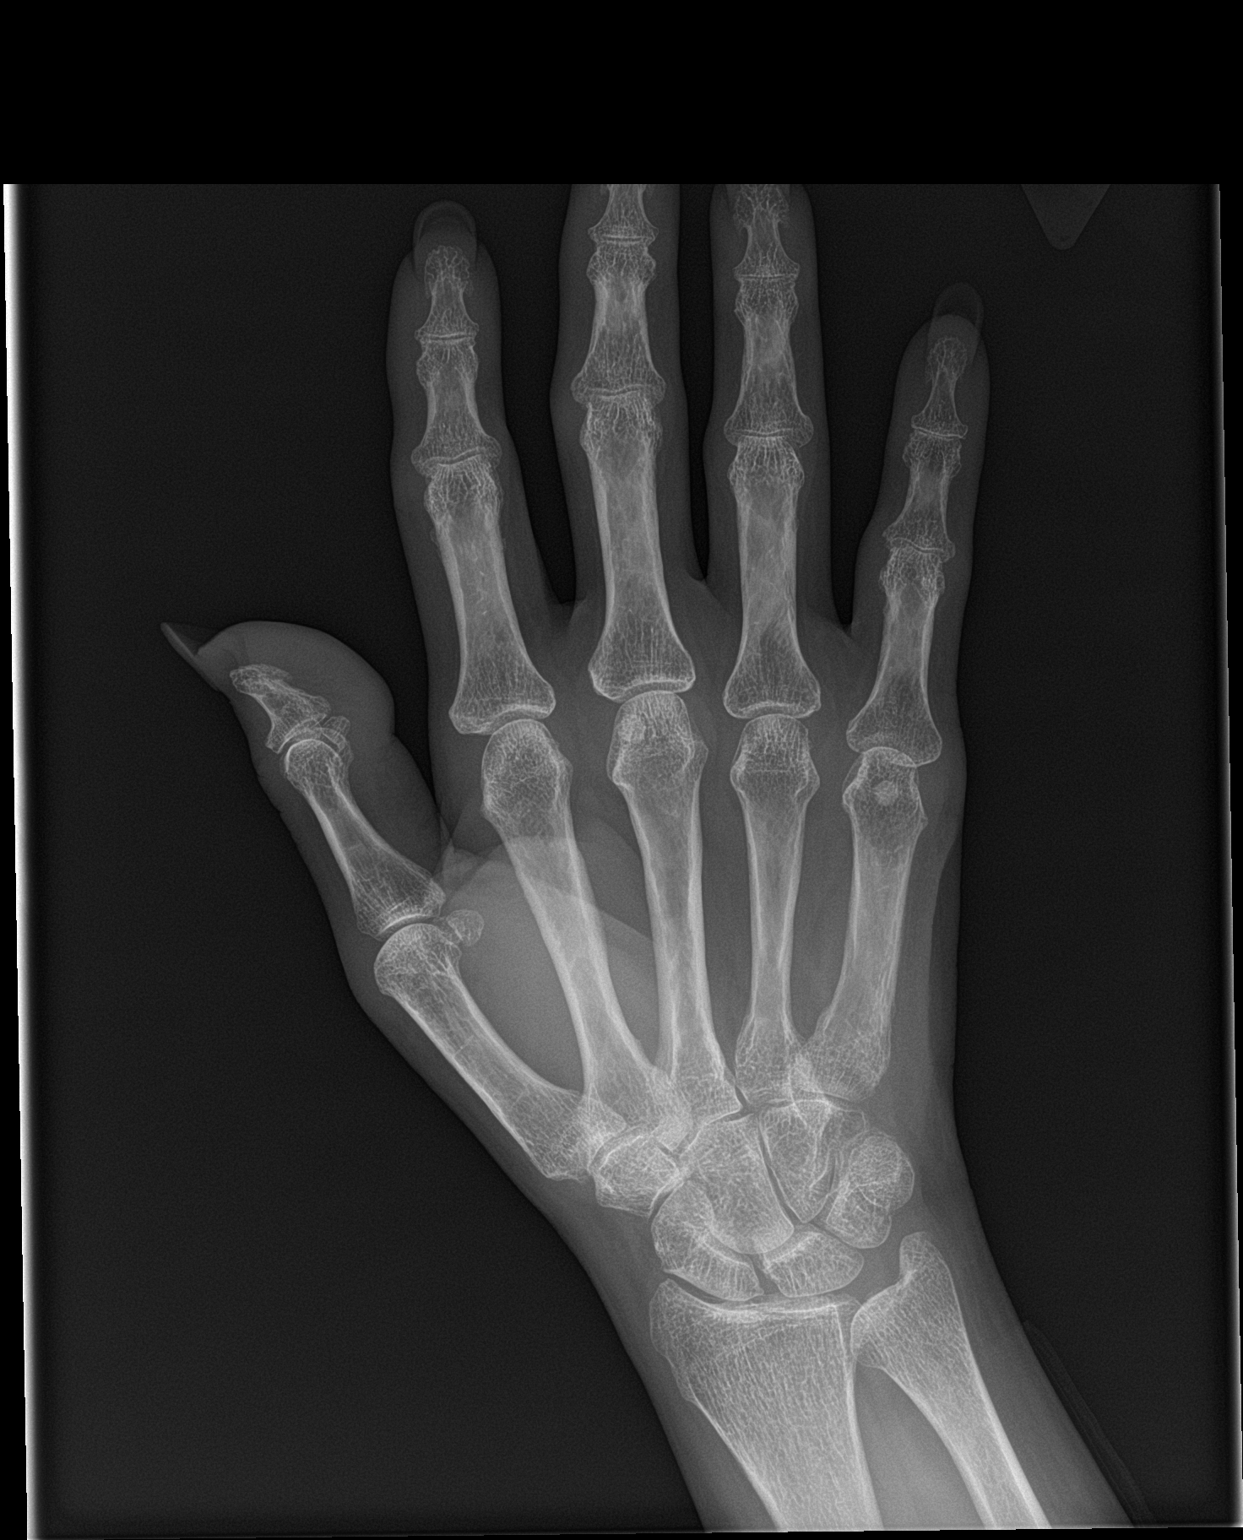

[hand obl]
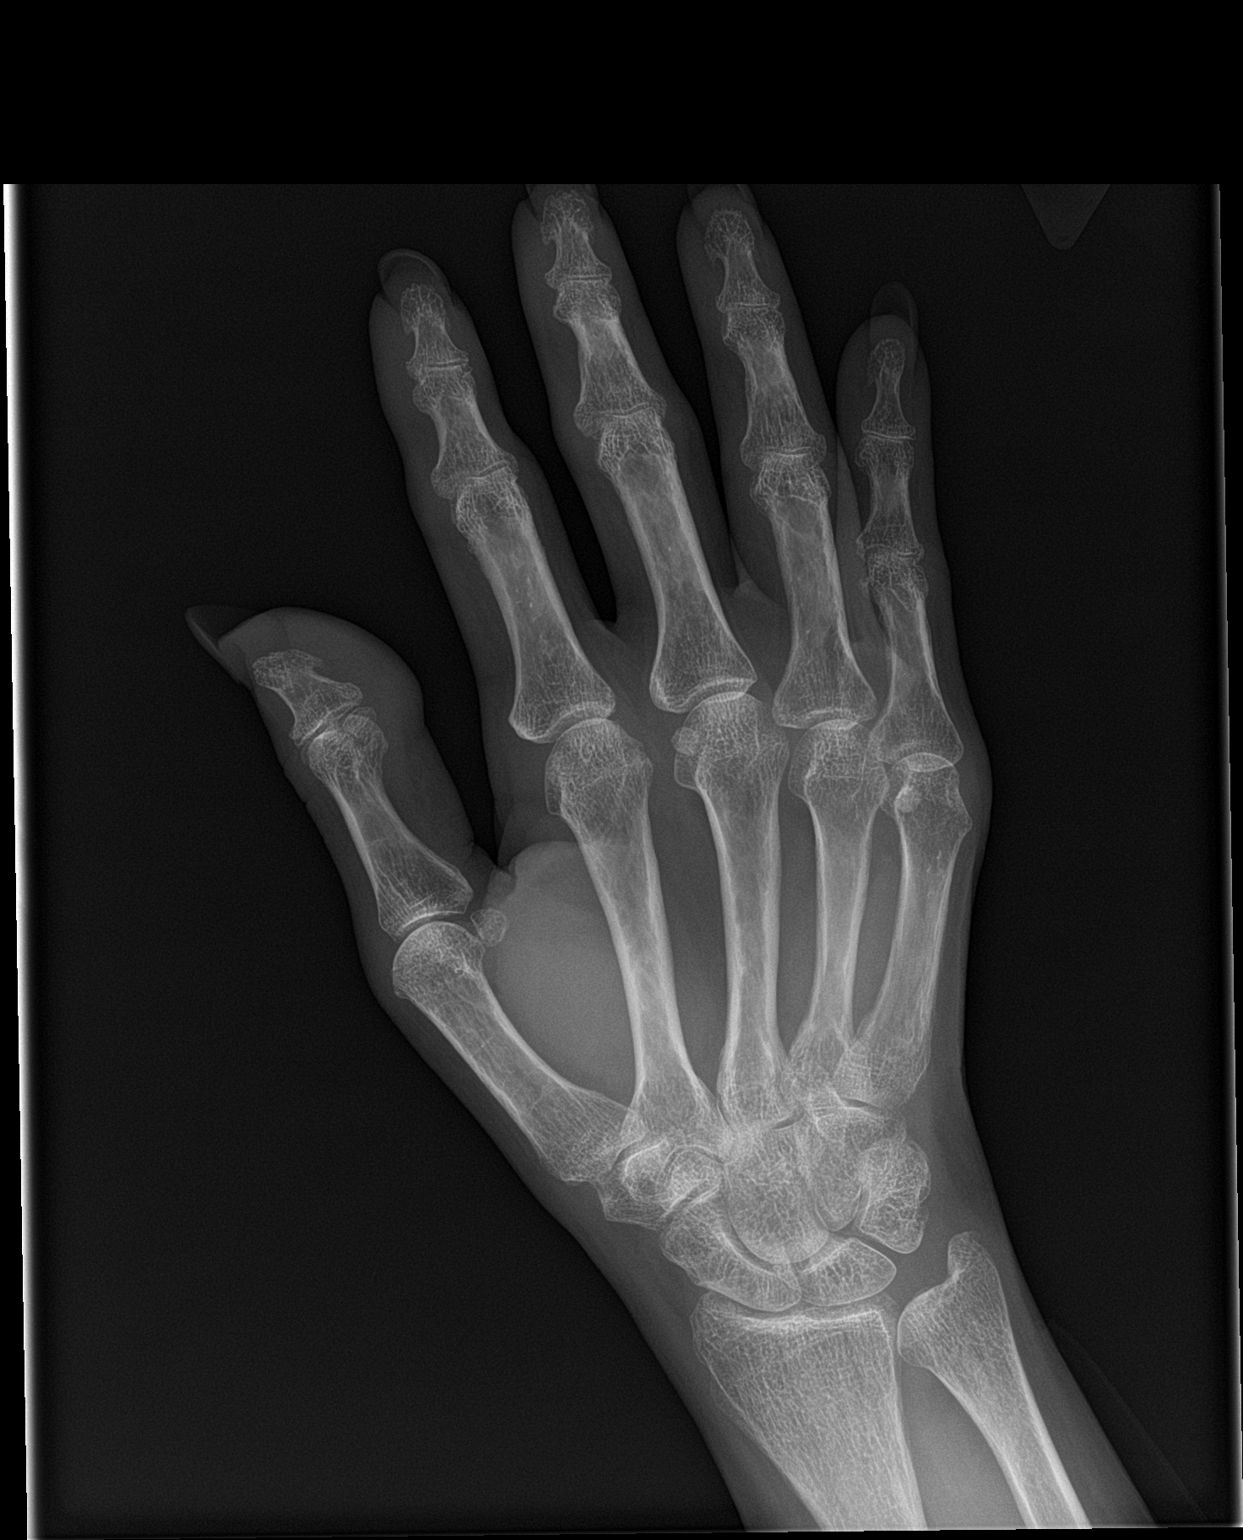

[hand lat]
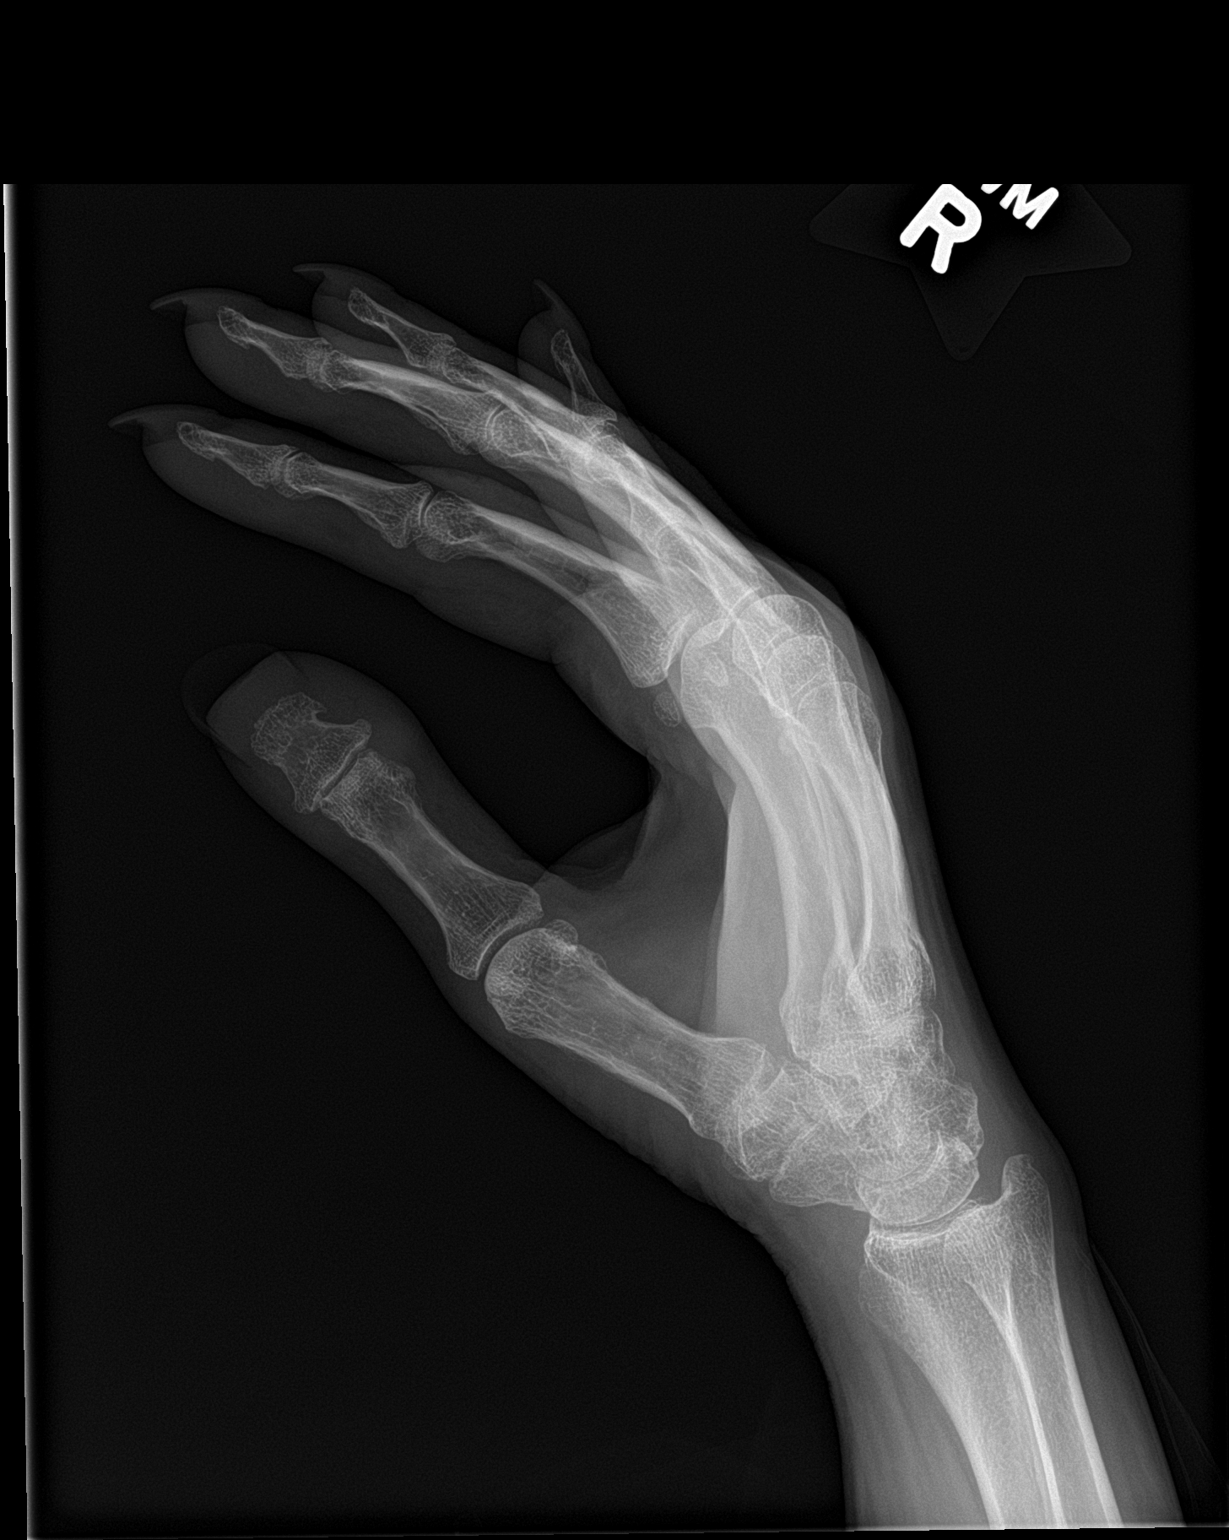

[3 of 3 positions shown; findings below may reference images not displayed]

FINDINGS: Three views of the right hand submitted. No acute fracture or
subluxation. No radiopaque foreign body.
IMPRESSION: Negative.
